# Patient Record
Sex: Female | Born: 1985 | Hispanic: No | Smoking: Never smoker
Health system: Southern US, Community
[De-identification: ages and names within clinical notes are randomized; demographics above are authoritative.]

## PROBLEM LIST (undated history)

## (undated) DIAGNOSIS — F419 Anxiety disorder, unspecified: Secondary | ICD-10-CM

## (undated) DIAGNOSIS — T7840XA Allergy, unspecified, initial encounter: Secondary | ICD-10-CM

## (undated) DIAGNOSIS — J4520 Mild intermittent asthma, uncomplicated: Secondary | ICD-10-CM

## (undated) DIAGNOSIS — N83202 Unspecified ovarian cyst, left side: Secondary | ICD-10-CM

## (undated) DIAGNOSIS — I10 Essential (primary) hypertension: Secondary | ICD-10-CM

## (undated) HISTORY — PX: TYMPANOSTOMY TUBE PLACEMENT: SHX32

## (undated) HISTORY — DX: Allergy, unspecified, initial encounter: T78.40XA

## (undated) HISTORY — DX: Unspecified ovarian cyst, left side: N83.202

## (undated) HISTORY — DX: Mild intermittent asthma, uncomplicated: J45.20

## (undated) HISTORY — PX: MOUTH SURGERY: SHX715

---

## 2005-10-05 LAB — CONVERTED CEMR LAB: Pap Smear: NORMAL

## 2008-03-10 ENCOUNTER — Emergency Department (HOSPITAL_COMMUNITY): Admission: EM | Admit: 2008-03-10 | Discharge: 2008-03-10 | Payer: Self-pay | Admitting: Emergency Medicine

## 2008-03-20 ENCOUNTER — Ambulatory Visit: Payer: Self-pay | Admitting: *Deleted

## 2008-03-20 DIAGNOSIS — J0100 Acute maxillary sinusitis, unspecified: Secondary | ICD-10-CM

## 2008-03-20 DIAGNOSIS — S058X9A Other injuries of unspecified eye and orbit, initial encounter: Secondary | ICD-10-CM | POA: Insufficient documentation

## 2008-03-20 DIAGNOSIS — L259 Unspecified contact dermatitis, unspecified cause: Secondary | ICD-10-CM | POA: Insufficient documentation

## 2008-04-19 ENCOUNTER — Ambulatory Visit: Payer: Self-pay | Admitting: *Deleted

## 2008-04-19 ENCOUNTER — Other Ambulatory Visit: Admission: RE | Admit: 2008-04-19 | Discharge: 2008-04-19 | Payer: Self-pay | Admitting: *Deleted

## 2008-04-19 ENCOUNTER — Encounter (INDEPENDENT_AMBULATORY_CARE_PROVIDER_SITE_OTHER): Payer: Self-pay | Admitting: *Deleted

## 2008-05-24 ENCOUNTER — Telehealth (INDEPENDENT_AMBULATORY_CARE_PROVIDER_SITE_OTHER): Payer: Self-pay | Admitting: *Deleted

## 2008-07-10 ENCOUNTER — Ambulatory Visit: Payer: Self-pay | Admitting: Internal Medicine

## 2008-07-10 DIAGNOSIS — R5383 Other fatigue: Secondary | ICD-10-CM

## 2008-07-10 DIAGNOSIS — J310 Chronic rhinitis: Secondary | ICD-10-CM

## 2008-07-10 DIAGNOSIS — R5381 Other malaise: Secondary | ICD-10-CM

## 2008-07-30 ENCOUNTER — Encounter (INDEPENDENT_AMBULATORY_CARE_PROVIDER_SITE_OTHER): Payer: Self-pay | Admitting: *Deleted

## 2009-10-17 ENCOUNTER — Telehealth: Payer: Self-pay | Admitting: Internal Medicine

## 2009-10-22 ENCOUNTER — Ambulatory Visit: Payer: Self-pay | Admitting: Family

## 2009-10-22 DIAGNOSIS — J321 Chronic frontal sinusitis: Secondary | ICD-10-CM | POA: Insufficient documentation

## 2009-10-28 ENCOUNTER — Telehealth: Payer: Self-pay | Admitting: Family

## 2010-07-08 NOTE — Progress Notes (Signed)
Summary: itching  Phone Note Call from Patient Call back at [phone]   Caller: Patient Reason for Call: Talk to Nurse Summary of Call: pt states she has vaginal itching, thinks it's a reaction to meds, can you call in Rx? Initial call taken by: Lannette Donath,  Oct 28, 2009 12:07 PM  Follow-up for Phone Call        Pt states the itching started 2 days ago. Denies vaginal discharge. Please advise.  Mervin Kung CMA  Oct 28, 2009 12:11 PM   Additional Follow-up for Phone Call Additional follow up Details #1::        Pls let patient know that I sent rx for Diflucan to her pharmacy.  She should call if symptoms not resolved in 1 week. Additional Follow-up by: Lemont Fillers FNP,  Oct 28, 2009 1:20 PM    Additional Follow-up for Phone Call Additional follow up Details #2::    Left message on machine to return my call.  Mervin Kung CMA  Oct 28, 2009 1:55 PM   Advised pt. of Marny Smethers's instructions. Pt voices understanding and asks that we send future rxs to [facility] on [address].  Pt will get current script from [facility].  [facility] was removed from her profile and [facility] has been added.  Mervin Kung CMA  Oct 28, 2009 3:52 PM   New/Updated Medications: FLUCONAZOLE 150 MG TABS (FLUCONAZOLE) one tablet by mouth now, repeat in 3 days if still experiencing itching. Prescriptions: FLUCONAZOLE 150 MG TABS (FLUCONAZOLE) one tablet by mouth now, repeat in 3 days if still experiencing itching.  #2 x 0   Entered and Authorized by:   Lemont Fillers FNP   Signed by:   Lemont Fillers FNP on 10/28/2009   Method used:   Electronically to        [facility]. [address] [phone]* (retail)       [address]       [place], [place]  [address]       Ph: [phone] or [phone]       Fax: [phone]   RxID:   [id number]

## 2010-07-08 NOTE — Progress Notes (Signed)
Summary: Allergy Medication  Phone Note Call from Patient Call back at [phone]   Reason for Call: Refill Medication Summary of Call: Pt states she needs refill of allergy meds, pls call Initial call taken by: Lannette Donath,  Oct 17, 2009 11:43 AM  Follow-up for Phone Call        call placed to patient at [phone], she states she has a cough, that  occurrs at night , she is having eye,  throat, and ear irritation,. She was advised to schedule an office visit, but declines, and states she is just having allergy symptoms and would like some medication for that. She was advised that I would check with the doctor and give her a call back  Follow-up by: Glendell Docker CMA,  Oct 17, 2009 5:45 PM  Additional Follow-up for Phone Call Additional follow up Details #1::        zyrtec, claritin, fexofenadine can be obtained over the counter Additional Follow-up by: D. Thomos Lemons DO,  Oct 18, 2009 7:59 AM    Additional Follow-up for Phone Call Additional follow up Details #2::    attempted to contact patient at [phone], no answer, detailed voice message left informing patient per Dr Artist Pais instructions. She was advised to schedule office visit if something stronger was needed Follow-up by: Glendell Docker CMA,  Oct 18, 2009 8:14 AM

## 2010-07-08 NOTE — Assessment & Plan Note (Signed)
Summary: SINUS INFECTION/HEA   Vital Signs:  Rebecca profile:   25 year old Roach Height:      64 inches Weight:      128 pounds BMI:     22.05 Temp:     97.2 degrees F oral Pulse rate:   90 / minute Pulse rhythm:   regular Resp:     18 per minute BP sitting:   128 / 79  (right arm) Cuff size:   regular  Vitals Entered By: Mervin Kung CMA (Oct 22, 2009 3:43 PM) CC: room 5  Pt states her allergies have gotten worse. Has head congestion, pressure, itchy throat and watery eyes. Symptoms seem worse at night. Meds do not seem to be helping. Is Rebecca Diabetic? No   Primary Care Provider:  Paulo Fruit MD  CC:  room 5  Pt states her allergies have gotten worse. Has head congestion, pressure, and itchy throat and watery eyes. Symptoms seem worse at night. Meds do not seem to be helping.Marland Kitchen  History of Present Illness: Rebecca Roach is a 25 year old Roach who presents with c/o itching eyes, sore/itching throat, itching ears.  + pressure over her forehead.  Clear runny nasal discharge.  Denies fever.  These symptoms have been present x 4 weeks.  These symptoms started around the same time that she got her cat and she wonders if these symptoms are due to cat allergies.  Allergies: 1)  ! * Oxycodone  Physical Exam  General:  Well-developed,well-nourished,in no acute distress; alert,appropriate and cooperative throughout examination Head:  Normocephalic and atraumatic without obvious abnormalities. No apparent alopecia or balding. Eyes:  PERRLA Ears:  Bilateral TM's are dull without bulging or erythema Mouth:  Oral mucosa and oropharynx without lesions or exudates.  Teeth in good repair. Lungs:  Normal respiratory effort, chest expands symmetrically. Lungs are clear to auscultation, no crackles or wheezes. Heart:  Normal rate and regular rhythm. S1 and S2 normal without gallop, murmur, click, rub or other extra sounds.   Impression & Recommendations:  Problem # 1:  FRONTAL  SINUSITIS (ICD-473.1) Assessment New Suspect early sinusitus.  Plan to continue fexofenadine and will at Fluticasone spray + amoxicillin course.  Discussed cat allergy with Rebecca.  Her allergic symptoms have coincided with high pollen counts.  If symptoms do not improve as pollen counts drop then she may need to more seriousy consider that her symptoms may be exacerbated by presece of cat in the home.  Her updated medication list for this problem includes:    Fluticasone Propionate 50 Mcg/act Susp (Fluticasone propionate) .Marland Kitchen... 2 sprays each nostril daily    Amoxicillin 500 Mg Caps (Amoxicillin) ..... One tab by mouth three times a day x 10 days  Complete Medication List: 1)  Fexofenadine Hcl 180 Mg Tabs (Fexofenadine hcl) .... One tab by mouth once daily as needed 2)  One-a-day Womens Formula Tabs (Multiple vitamins-calcium) .... Take 1 tablet by mouth once a day 3)  Fluticasone Propionate 50 Mcg/act Susp (Fluticasone propionate) .... 2 sprays each nostril daily 4)  Amoxicillin 500 Mg Caps (Amoxicillin) .... One tab by mouth three times a day x 10 days  Rebecca Instructions: 1)  Call if symptoms worsen or do not improve.  Prescriptions: FLUTICASONE PROPIONATE 50 MCG/ACT SUSP (FLUTICASONE PROPIONATE) 2 sprays each nostril daily  #1 x 1   Entered and Authorized by:   Lemont Fillers FNP   Signed by:   Lemont Fillers FNP on 10/22/2009   Method used:  Electronically to        [facility]. [address] [phone]* (retail)       [address]       [place], [place]  [address]       Ph: [phone] or [phone]       Fax: [phone]   RxID:   [id number] AMOXICILLIN 500 MG CAPS (AMOXICILLIN) one tab by mouth three times a day x 10 days  #30 x 0   Entered and Authorized by:   Lemont Fillers FNP   Signed by:   Lemont Fillers FNP on 10/22/2009   Method used:   Electronically to        [facility]. [address] [phone]* (retail)       [address]       [place], [place]  [address]       Ph: [phone] or [phone]       Fax: [phone]   RxID:   [id number] FLUTICASONE PROPIONATE 50 MCG/ACT SUSP (FLUTICASONE PROPIONATE) 2 sprays each nostril daily  #1 x 1   Entered and Authorized by:   Lemont Fillers FNP   Signed by:   Lemont Fillers FNP on 10/22/2009   Method used:   Electronically to        [facility]. # [id number]* (retail)       [address] [place], [place]  [address]       Ph: [phone]       Fax: [phone]   RxID:   [id number]   Current Allergies (reviewed today): ! * OXYCODONE

## 2011-03-17 ENCOUNTER — Other Ambulatory Visit: Payer: Self-pay | Admitting: Family Medicine

## 2011-03-17 ENCOUNTER — Other Ambulatory Visit (HOSPITAL_COMMUNITY)
Admission: RE | Admit: 2011-03-17 | Discharge: 2011-03-17 | Disposition: A | Discharge status: Home / Self Care | Payer: Managed Care, Other (non HMO) | Source: Ambulatory Visit | Attending: Family Medicine | Admitting: Family Medicine

## 2011-03-17 DIAGNOSIS — Z124 Encounter for screening for malignant neoplasm of cervix: Secondary | ICD-10-CM | POA: Insufficient documentation

## 2011-11-16 ENCOUNTER — Encounter (HOSPITAL_COMMUNITY): Payer: Self-pay | Admitting: *Deleted

## 2011-11-16 ENCOUNTER — Inpatient Hospital Stay (HOSPITAL_COMMUNITY): Payer: Managed Care, Other (non HMO)

## 2011-11-16 ENCOUNTER — Inpatient Hospital Stay (HOSPITAL_COMMUNITY)
Admission: AD | Admit: 2011-11-16 | Discharge: 2011-11-16 | Disposition: A | Discharge status: Home / Self Care | Payer: Managed Care, Other (non HMO) | Source: Ambulatory Visit | Attending: Obstetrics and Gynecology | Admitting: Obstetrics and Gynecology

## 2011-11-16 DIAGNOSIS — O034 Incomplete spontaneous abortion without complication: Secondary | ICD-10-CM | POA: Insufficient documentation

## 2011-11-16 HISTORY — DX: Anxiety disorder, unspecified: F41.9

## 2011-11-16 LAB — CBC
HCT: 35.5 % — ABNORMAL LOW (ref 36.0–46.0)
MCH: 31.2 pg (ref 26.0–34.0)
MCHC: 34.1 g/dL (ref 30.0–36.0)
MCV: 91.5 fL (ref 78.0–100.0)
Platelets: 212 10*3/uL (ref 150–400)
RDW: 12.6 % (ref 11.5–15.5)

## 2011-11-16 LAB — URINALYSIS, ROUTINE W REFLEX MICROSCOPIC
Bilirubin Urine: NEGATIVE
Nitrite: NEGATIVE
Specific Gravity, Urine: 1.025 (ref 1.005–1.030)
pH: 5.5 (ref 5.0–8.0)

## 2011-11-16 LAB — URINE MICROSCOPIC-ADD ON

## 2011-11-16 LAB — POCT PREGNANCY, URINE: Preg Test, Ur: POSITIVE — AB

## 2011-11-16 LAB — WET PREP, GENITAL

## 2011-11-16 NOTE — MAU Provider Note (Signed)
Rebecca Roach y.o.G1P0 @[redacted]w[redacted]d  by LMP Chief Complaint  Patient presents with  . Back Pain  . Vaginal Bleeding     First Provider Initiated Contact with Patient 11/16/11 1426      SUBJECTIVE  HPI: Pt presents with dark brown vaginal spotting today and bright red bleeding yesterday reporting 3 soaked pads.  She is having mild abdominal cramping also.  She denies LOF, vaginal itching/burning, urinary symptoms, h/a, dizziness, n/v, or fever/chills.    Past Medical History  Diagnosis Date  . Anxiety    Past Surgical History  Procedure Date  . No past surgeries    History   Social History  . Marital Status: Married    Spouse Name: N/A    Number of Children: N/A  . Years of Education: N/A   Occupational History  . Not on file.   Social History Main Topics  . Smoking status: Never Smoker   . Smokeless tobacco: Not on file  . Alcohol Use: No  . Drug Use: No  . Sexually Active: Yes    Birth Control/ Protection: None   Other Topics Concern  . Not on file   Social History Narrative  . No narrative on file   No current facility-administered medications on file prior to encounter.   Current Outpatient Prescriptions on File Prior to Encounter  Medication Sig Dispense Refill  . budesonide (RHINOCORT AQUA) 32 MCG/ACT nasal spray Place 1 spray into the nose at bedtime.      Marland Kitchen escitalopram (LEXAPRO) 10 MG tablet Take 10 mg by mouth at bedtime.      . montelukast (SINGULAIR) 10 MG tablet Take 10 mg by mouth at bedtime.       Allergies  Allergen Reactions  . Oxycodone Nausea And Vomiting  . Latex Rash    ROS: Pertinent items in HPI  OBJECTIVE Blood pressure 127/59, pulse 83, temperature 97.4 F (36.3 C), temperature source Oral, resp. rate 18, height 5\' 4"  (1.626 m), weight 61.961 kg (136 lb 9.6 oz), last menstrual period 10/06/2011.  GENERAL: Well-developed, well-nourished female in no acute distress.  HEENT: Normocephalic, good dentition HEART: normal rate RESP:  normal effort ABDOMEN: Soft, nontender EXTREMITIES: Nontender, no edema NEURO: Alert and oriented Pelvic exam: Cervix pink, visually closed, without lesion, POC visible at cervical os, removed easily with ring forceps, scant white discharge noted, vaginal walls and external genitalia normal Bimanual exam: Cervix 0/long/high,soft, anterior, neg CMT, uterus nontender, nonenlarged, adnexa without tenderness, enlargement, or mass   LAB RESULTS  Results for orders placed during the hospital encounter of 11/16/11 (from the past 24 hour(s))  URINALYSIS, ROUTINE W REFLEX MICROSCOPIC     Status: Abnormal   Collection Time   11/16/11  1:40 PM      Component Value Range   Color, Urine YELLOW  YELLOW    APPearance CLEAR  CLEAR    Specific Gravity, Urine 1.025  1.005 - 1.030    pH 5.5  5.0 - 8.0    Glucose, UA NEGATIVE  NEGATIVE (mg/dL)   Hgb urine dipstick LARGE (*) NEGATIVE    Bilirubin Urine NEGATIVE  NEGATIVE    Ketones, ur NEGATIVE  NEGATIVE (mg/dL)   Protein, ur NEGATIVE  NEGATIVE (mg/dL)   Urobilinogen, UA 0.2  0.0 - 1.0 (mg/dL)   Nitrite NEGATIVE  NEGATIVE    Leukocytes, UA NEGATIVE  NEGATIVE   URINE MICROSCOPIC-ADD ON     Status: Abnormal   Collection Time   11/16/11  1:40 PM  Component Value Range   Squamous Epithelial / LPF FEW (*) RARE    WBC, UA 0-2  <3 (WBC/hpf)   RBC / HPF 7-10  <3 (RBC/hpf)  POCT PREGNANCY, URINE     Status: Abnormal   Collection Time   11/16/11  2:04 PM      Component Value Range   Preg Test, Ur POSITIVE (*) NEGATIVE   CBC     Status: Abnormal   Collection Time   11/16/11  2:30 PM      Component Value Range   WBC 6.7  4.0 - 10.5 (K/uL)   RBC 3.88  3.87 - 5.11 (MIL/uL)   Hemoglobin 12.1  12.0 - 15.0 (g/dL)   HCT 16.1 (*) 09.6 - 46.0 (%)   MCV 91.5  78.0 - 100.0 (fL)   MCH 31.2  26.0 - 34.0 (pg)   MCHC 34.1  30.0 - 36.0 (g/dL)   RDW 04.5  40.9 - 81.1 (%)   Platelets 212  150 - 400 (K/uL)  ABO/RH     Status: Normal   Collection Time   11/16/11   2:30 PM      Component Value Range   ABO/RH(D) A POS    HCG, QUANTITATIVE, PREGNANCY     Status: Abnormal   Collection Time   11/16/11  2:30 PM      Component Value Range   hCG, Beta Chain, Quant, S 1962 (*) <5 (mIU/mL)  WET PREP, GENITAL     Status: Abnormal   Collection Time   11/16/11  3:01 PM      Component Value Range   Yeast Wet Prep HPF POC NONE SEEN  NONE SEEN    Trich, Wet Prep NONE SEEN  NONE SEEN    Clue Cells Wet Prep HPF POC NONE SEEN  NONE SEEN    WBC, Wet Prep HPF POC FEW (*) NONE SEEN     IMAGING   ASSESSMENT Incomplete miscarriage  PLAN D/C home with bleeding precautions POC sent to pathology Return to MAU on Wednesday after 3pm for quantitative HCG Discussed expected miscarriage with pt and pt given opportunity to talk with Chaplain but she denied needing this Will review findings on Wednesday and create f/u plan  LEFTWICH-KIRBY, Bleu Minerd 11/16/2011 3:01 PM

## 2011-11-16 NOTE — Discharge Instructions (Signed)
Miscarriage  An early pregnancy loss or spontaneous abortion (miscarriage) is a common problem. This usually happens when the pregnancy is not developing normally. It is very unlikely that you or your partner did anything to cause this, although cigarette smoking, a sexually transmitted disease, excessive alcohol use, or drug abuse can increase the risk. Other causes are:   Abnormalities of the uterus.   Hormone or medical problems.   Trauma or genetic (chromosome) problems.  Having a miscarriage does not change your chances of having a normal pregnancy in the future. Your caregiver will advise you when it is safe to try to get pregnant again.  AFTER A MISCARRIAGE   A miscarriage is inevitable when there is continual, heavy vaginal bleeding; cramping; dilation of the cervix; or passing of any pregnancy tissue. Bleeding and cramping will usually continue until all the tissue has been removed from the womb (uterus).   Often the uterus does not clean itself out completely. A medication or a D&C procedure is needed to loosen or remove the pregnancy tissue from the uterus. A D&C scrapes or suctions the tissue out.   If you are RH negative, you may need to have Rh immune globulin to avoid Rh problems.   You may be given medication to fight an infection if the miscarriage was due to an infection.  HOME CARE INSTRUCTIONS    You should rest in bed for the next 2 to 3 days.   Do not take tub baths or put anything in your vagina, including tampons or a douche.   Do not have sex until your caregiver approves.   Avoid exercise or heavy activities until directed by your caregiver.   Save any vaginal discharge that looks like tissue. Ask your caregiver if he or she wants to inspect the discharge.   If you and your partner are having problems with guilt or grieving, talk to your caregiver or get counseling to help you understand and cope with your pregnancy loss.   Allow enough time to grieve before trying to get  pregnant again.  SEEK IMMEDIATE MEDICAL CARE IF:    You have persistent heavy bleeding or a bad smelling vaginal discharge.   You have continued abdominal or pelvic pain.   You have an oral temperature above 102 F (38.9 C), not controlled by medicine.   You have severe weakness, fainting, or keep throwing up (vomiting).   You develop chills.   You are experiencing domestic violence.  MAKE SURE YOU:    Understand these instructions.   Will watch your condition.   Will get help right away if you are not doing well or get worse.  Document Released: 07/02/2004 Document Revised: 05/14/2011 Document Reviewed: 05/17/2008  Exit[employer] Patient Information 2012 [employer].

## 2011-11-16 NOTE — MAU Note (Signed)
Pt in c/o vaginal bleeding since yesterday, reports seeing multiple "stringy clots".  States bleeding was heavier yesterday, went through 3 pads, has only used 1 pad today.  Reports lower back pain for a couple days.  States lower abdomen just feels full, no real pain.  Has hx of irregular periods.  Believes lmp was around end of April.

## 2011-11-17 ENCOUNTER — Encounter (HOSPITAL_COMMUNITY): Payer: Self-pay | Admitting: *Deleted

## 2011-11-17 ENCOUNTER — Inpatient Hospital Stay (HOSPITAL_COMMUNITY)
Admission: AD | Admit: 2011-11-17 | Discharge: 2011-11-17 | Disposition: A | Discharge status: Home / Self Care | Payer: Managed Care, Other (non HMO) | Source: Ambulatory Visit | Attending: Family Medicine | Admitting: Family Medicine

## 2011-11-17 DIAGNOSIS — O200 Threatened abortion: Secondary | ICD-10-CM | POA: Insufficient documentation

## 2011-11-17 DIAGNOSIS — O209 Hemorrhage in early pregnancy, unspecified: Secondary | ICD-10-CM

## 2011-11-17 LAB — HCG, QUANTITATIVE, PREGNANCY: hCG, Beta Chain, Quant, S: 1766 m[IU]/mL — ABNORMAL HIGH (ref <5)

## 2011-11-17 LAB — GC/CHLAMYDIA PROBE AMP, GENITAL
Chlamydia, DNA Probe: NEGATIVE
GC Probe Amp, Genital: NEGATIVE

## 2011-11-17 MED ORDER — IBUPROFEN 600 MG PO TABS: 600.0000 mg | ORAL_TABLET | Freq: Four times a day (QID) | ORAL | Status: AC | PRN

## 2011-11-17 MED ORDER — IBUPROFEN 600 MG PO TABS: 600.0000 mg | ORAL_TABLET | Freq: Four times a day (QID) | ORAL | Status: DC | PRN

## 2011-11-17 NOTE — MAU Note (Signed)
Was here yesterday, had [place] and blood work. Was to come tomorrow for repeat blood work tomorrow.  Pain has gotten worse, bleeding is continues and is getting forgetful.

## 2011-11-17 NOTE — Discharge Instructions (Signed)
Threatened Miscarriage  A threatened miscarriage is a pregnancy that may end. It may be marked by bleeding during the first 20 weeks of pregnancy. Often, the pregnancy can continue without any more problems. You may be asked to stop:  Having sex (intercourse).   Having orgasms.   Using tampons.   Exercising.   Doing heavy physical activity and work.  HOME CARE   Your doctor may tell you to take bed rest and to stop activities and work.   Write down the number of pads you use each day. Write down how often you change pads. Write down how soaked they are.   Follow your doctor's advice for follow-up visits and tests.   If your blood type is Rh-negative and the father's blood is Rh-positive (or is not known), you may get a shot to protect the baby.   If you have a miscarriage, save all the tissue you pass in a container. Take the container to your doctor.  GET HELP RIGHT AWAY IF:   You have bad cramps or pain in your belly (abdomen), lower belly, or back.   You have a fever or chills.   Your bleeding gets worse or you pass large clots of blood or tissue. Save this tissue to show your doctor.   You feel lightheaded, weak, dizzy, or pass out (faint).   You have a gush of fluid from your vagina.  MAKE SURE YOU:   Understand these instructions.   Will watch your condition.   Will get help right away if you are not doing well or get worse.  Document Released: 05/07/2008 Document Revised: 05/14/2011 Document Reviewed: 06/10/2009 Exit[employer] Patient Information 2012 [employer]. 

## 2011-11-17 NOTE — MAU Provider Note (Signed)
Rebecca Roach [id number] y.o.G1P0 @[redacted]w[redacted]d  by LMP Chief Complaint  Patient presents with  . Follow-up     First Provider Initiated Contact with Patient 11/17/11 1845      SUBJECTIVE  HPI: Seen here yesterday for early pregnancy bleeding and falling MAU passed what was thought to be tissue and this was sent to pathology. Her ultrasound showed heterogenous thickening of endometrium possible POC versus very early pregnancy, no gestational sac was seen, adnexae were negative. Plan was to repeat upon tomorrow however she comes today saying that she felt weak and dizzy prior to coming in and was concerned that she is still bleeding and cramping. She took 2 tablets 200 mg ibuprofen last night and 1 tablet this morning. She is on only her second pad of the day.  Past Medical History  Diagnosis Date  . Anxiety    Past Surgical History  Procedure Date  . Mouth surgery     gum   History   Social History  . Marital Status: Married    Spouse Name: N/A    Number of Children: N/A  . Years of Education: N/A   Occupational History  . Not on file.   Social History Main Topics  . Smoking status: Never Smoker   . Smokeless tobacco: Not on file  . Alcohol Use: No  . Drug Use: No  . Sexually Active: Yes    Birth Control/ Protection: None   Other Topics Concern  . Not on file   Social History Narrative  . No narrative on file   No current facility-administered medications on file prior to encounter.   Current Outpatient Prescriptions on File Prior to Encounter  Medication Sig Dispense Refill  . Biotin 1000 MCG tablet Take 1,000 mcg by mouth at bedtime.      . budesonide (RHINOCORT AQUA) 32 MCG/ACT nasal spray Place 1 spray into the nose at bedtime.      . cholecalciferol (VITAMIN D) 1000 UNITS tablet Take 1,000 Units by mouth at bedtime.      Marland Kitchen escitalopram (LEXAPRO) 10 MG tablet Take 10 mg by mouth at bedtime.      . montelukast (SINGULAIR) 10 MG tablet Take 10 mg by mouth at bedtime.        . Multiple Vitamin (MULTIVITAMIN WITH MINERALS) TABS Take 1 tablet by mouth at bedtime.       Allergies  Allergen Reactions  . Oxycodone Nausea And Vomiting  . Latex Rash    ROS: Pertinent items in HPI  OBJECTIVE Blood pressure 116/54, pulse 83, temperature 98.3 F (36.8 C), temperature source Oral, resp. rate 20, height 5' 4.5" (1.638 m), weight 63.504 kg (140 lb), last menstrual period 10/06/2011.  Orthostatic VS reviewed  GENERAL: Well-developed, well-nourished female in no acute distress.  HEENT: Normocephalic, good dentition HEART: normal rate RESP: normal effort ABDOMEN: Soft, nontender EXTREMITIES: Nontender, no edema NEURO: Alert and oriented SPECULUM EXAM: NEFG, small amt dark blood blood noted, cervix clean and no blood seen coming from os BIMANUAL: cervix closed; uterus NT, slightly enlarged; no adnexal tenderness or masses    LAB RESULTS Results for orders placed during the hospital encounter of 11/17/11 (from the past 24 hour(s))  HCG, QUANTITATIVE, PREGNANCY     Status: Abnormal   Collection Time   11/17/11  4:56 PM      Component Value Range   hCG, Beta Chain, Quant, S 1766 (*) <5 (mIU/mL)   Path result pending from tissue sent yesterday pending  ASSESSMENT Early pregnancy bleeding, likely SAB Hemodynamically stable  PLAN Rx ibuprofen 600 mg by mouth every 6 hours D/W Dr.Pratt: repeat quant in 2 days. Home with bleeding precautions     Cathi Hazan 11/17/2011 6:50 PM

## 2011-11-18 NOTE — MAU Provider Note (Signed)
Agree with above note.  Rebecca Roach 11/18/2011 9:46 AM

## 2011-11-18 NOTE — MAU Provider Note (Signed)
Chart reviewed and agree with management and plan.  

## 2011-11-19 ENCOUNTER — Encounter (HOSPITAL_COMMUNITY): Payer: Self-pay | Admitting: *Deleted

## 2011-11-19 ENCOUNTER — Inpatient Hospital Stay (HOSPITAL_COMMUNITY)
Admission: AD | Admit: 2011-11-19 | Discharge: 2011-11-19 | Disposition: A | Discharge status: Home / Self Care | Payer: Managed Care, Other (non HMO) | Source: Ambulatory Visit | Attending: Obstetrics & Gynecology | Admitting: Obstetrics & Gynecology

## 2011-11-19 DIAGNOSIS — R5383 Other fatigue: Secondary | ICD-10-CM | POA: Insufficient documentation

## 2011-11-19 DIAGNOSIS — M549 Dorsalgia, unspecified: Secondary | ICD-10-CM | POA: Insufficient documentation

## 2011-11-19 DIAGNOSIS — O99891 Other specified diseases and conditions complicating pregnancy: Secondary | ICD-10-CM | POA: Insufficient documentation

## 2011-11-19 DIAGNOSIS — R5381 Other malaise: Secondary | ICD-10-CM | POA: Insufficient documentation

## 2011-11-19 LAB — HCG, QUANTITATIVE, PREGNANCY: hCG, Beta Chain, Quant, S: 1365 m[IU]/mL — ABNORMAL HIGH (ref <5)

## 2011-11-19 NOTE — MAU Note (Signed)
TOOK IBUPROFEN AT 430PM-- FOR BACK PAIN- THIS STARTED  ON Tuesday. SHE FEELS WEAK - BUT NOT AS BAD AS YESTERDAY.   SAYS WHEN SHE WIPES - IT LOOKS LIKE  BLOOD  WITH BLOOD CLOTS-  QUARTER SIZE-  THIS IS SAME AS HAS BEEN.Marland Kitchen  HAD LABS DRAWN ON Monday.

## 2011-11-20 NOTE — OB Triage Provider Note (Signed)
S: 27 y.o. G1P0 @[redacted]w[redacted]d  by LMP  Pt presents to MAU for repeat quantitative HCG today.  She reports some light vaginal bleeding and mild cramping, and this has not changed since her previous visits to the MAU on 11/16/11 and 11/17/11.   O:  Recent Results (from the past 168 hour(s))  URINALYSIS, ROUTINE W REFLEX MICROSCOPIC   Collection Time   11/16/11  1:40 PM      Component Value Range   Color, Urine YELLOW  YELLOW   APPearance CLEAR  CLEAR   Specific Gravity, Urine 1.025  1.005 - 1.030   pH 5.5  5.0 - 8.0   Glucose, UA NEGATIVE  NEGATIVE mg/dL   Hgb urine dipstick LARGE (*) NEGATIVE   Bilirubin Urine NEGATIVE  NEGATIVE   Ketones, ur NEGATIVE  NEGATIVE mg/dL   Protein, ur NEGATIVE  NEGATIVE mg/dL   Urobilinogen, UA 0.2  0.0 - 1.0 mg/dL   Nitrite NEGATIVE  NEGATIVE   Leukocytes, UA NEGATIVE  NEGATIVE  URINE MICROSCOPIC-ADD ON   Collection Time   11/16/11  1:40 PM      Component Value Range   Squamous Epithelial / LPF FEW (*) RARE   WBC, UA 0-2  <3 WBC/hpf   RBC / HPF 7-10  <3 RBC/hpf  POCT PREGNANCY, URINE   Collection Time   11/16/11  2:04 PM      Component Value Range   Preg Test, Ur POSITIVE (*) NEGATIVE  CBC   Collection Time   11/16/11  2:30 PM      Component Value Range   WBC 6.7  4.0 - 10.5 K/uL   RBC 3.88  3.87 - 5.11 MIL/uL   Hemoglobin 12.1  12.0 - 15.0 g/dL   HCT 16.1 (*) 09.6 - 04.5 %   MCV 91.5  78.0 - 100.0 fL   MCH 31.2  26.0 - 34.0 pg   MCHC 34.1  30.0 - 36.0 g/dL   RDW 40.9  81.1 - 91.4 %   Platelets 212  150 - 400 K/uL  ABO/RH   Collection Time   11/16/11  2:30 PM      Component Value Range   ABO/RH(D) A POS    HCG, QUANTITATIVE, PREGNANCY   Collection Time   11/16/11  2:30 PM      Component Value Range   hCG, Beta Chain, Quant, S 1962 (*) <5 mIU/mL  WET PREP, GENITAL   Collection Time   11/16/11  3:01 PM      Component Value Range   Yeast Wet Prep HPF POC NONE SEEN  NONE SEEN   Trich, Wet Prep NONE SEEN  NONE SEEN   Clue Cells Wet Prep HPF POC  NONE SEEN  NONE SEEN   WBC, Wet Prep HPF POC FEW (*) NONE SEEN  GC/CHLAMYDIA PROBE AMP, GENITAL   Collection Time   11/16/11  3:01 PM      Component Value Range   GC Probe Amp, Genital NEGATIVE  NEGATIVE   Chlamydia, DNA Probe NEGATIVE  NEGATIVE  HCG, QUANTITATIVE, PREGNANCY   Collection Time   11/17/11  4:56 PM      Component Value Range   hCG, Beta Chain, Quant, S 1766 (*) <5 mIU/mL  HCG, QUANTITATIVE, PREGNANCY   Collection Time   11/19/11  7:06 PM      Component Value Range   hCG, Beta Chain, Quant, S 1365 (*) <5 mIU/mL    A: Decrease in HCG indicates likely SAB, unlikely ectopic pregnancy  P:  Called Dr Debroah Loop to discuss U/S findings of no IUP on 11/16/11 and repeat HCG labs D/C home with ectopic pregnancy teaching/precautions, and bleeding precautions Return to MAU for quantitative HCG in 1 week Return to MAU sooner as needed  Rebecca Roach, Rebecca Roach

## 2011-11-25 ENCOUNTER — Inpatient Hospital Stay (HOSPITAL_COMMUNITY)
Admission: AD | Admit: 2011-11-25 | Discharge: 2011-11-25 | Disposition: A | Discharge status: Home / Self Care | Payer: Managed Care, Other (non HMO) | Source: Ambulatory Visit | Attending: Obstetrics & Gynecology | Admitting: Obstetrics & Gynecology

## 2011-11-25 DIAGNOSIS — O039 Complete or unspecified spontaneous abortion without complication: Secondary | ICD-10-CM | POA: Insufficient documentation

## 2011-11-25 LAB — HCG, QUANTITATIVE, PREGNANCY: hCG, Beta Chain, Quant, S: 973 m[IU]/mL — ABNORMAL HIGH (ref <5)

## 2011-11-25 NOTE — MAU Note (Signed)
Pt is here for repeat BHCG. "light  Brown bleeding and small clots", denies pain at this time

## 2011-11-25 NOTE — MAU Note (Signed)
Lab results completed, pt not in lobby. She left message with registration that she would return .

## 2011-11-25 NOTE — MAU Provider Note (Signed)
History   Chief Complaint:  No chief complaint on file.   Rebecca Roach is  26 y.o. G1P0 Patient's last menstrual period was 10/06/2011.Marland Kitchen Patient is here for follow up of quantitative HCG and ongoing surveillance of pregnancy status.     Since her last visit, the patient is without new complaint.   The patient reports bleeding as  brown and spotting.    General ROS:  negative  Her previous Quantitative HCG values are: 6/10: 1962, 6/11: 1766, 6/13: 1365    Physical Exam   Last menstrual period 10/06/2011.  Focused Gynecological Exam: examination not indicated  Labs: Recent Results (from the past 24 hour(s))  HCG, QUANTITATIVE, PREGNANCY   Collection Time   11/25/11  9:15 PM      Component Value Range   hCG, Beta Chain, Quant, S 973 (*) <5 mIU/mL     Assessment: SAB   Plan: Patient called with FU quant results Reviewed SAB diagnoses and anticipatory guidance, expectations. Pt offered FU appt in [facility]. Declined. States she has an appt with PCP at [facility] July.    Dorotea Hand E. 11/25/2011, 10:42 PM

## 2011-11-25 NOTE — MAU Note (Signed)
Pt calls back to get results instead of returning. [place],CNM spoke with pt and gave results and instructions.

## 2012-04-20 ENCOUNTER — Ambulatory Visit (INDEPENDENT_AMBULATORY_CARE_PROVIDER_SITE_OTHER): Payer: Managed Care, Other (non HMO) | Admitting: Sports Medicine

## 2012-04-20 ENCOUNTER — Encounter: Payer: Self-pay | Admitting: Sports Medicine

## 2012-04-20 VITALS — BP 144/87 | HR 99 | Wt 141.0 lb

## 2012-04-20 DIAGNOSIS — R635 Abnormal weight gain: Secondary | ICD-10-CM | POA: Insufficient documentation

## 2012-04-20 DIAGNOSIS — F411 Generalized anxiety disorder: Secondary | ICD-10-CM

## 2012-04-20 DIAGNOSIS — M791 Myalgia, unspecified site: Secondary | ICD-10-CM

## 2012-04-20 DIAGNOSIS — R5383 Other fatigue: Secondary | ICD-10-CM

## 2012-04-20 DIAGNOSIS — R5381 Other malaise: Secondary | ICD-10-CM

## 2012-04-20 DIAGNOSIS — M4310 Spondylolisthesis, site unspecified: Secondary | ICD-10-CM | POA: Insufficient documentation

## 2012-04-20 DIAGNOSIS — F419 Anxiety disorder, unspecified: Secondary | ICD-10-CM | POA: Insufficient documentation

## 2012-04-20 DIAGNOSIS — IMO0001 Reserved for inherently not codable concepts without codable children: Secondary | ICD-10-CM

## 2012-04-20 DIAGNOSIS — Z299 Encounter for prophylactic measures, unspecified: Secondary | ICD-10-CM | POA: Insufficient documentation

## 2012-04-20 MED ORDER — MELOXICAM 15 MG PO TABS: ORAL_TABLET | ORAL | Status: DC

## 2012-04-20 NOTE — Assessment & Plan Note (Signed)
Lipid panel. She will come back fasting for this.

## 2012-04-20 NOTE — Assessment & Plan Note (Signed)
We'll check CBC, TSH, CMET. Mood is okay per patient.

## 2012-04-20 NOTE — Assessment & Plan Note (Signed)
Recently did a two-mile run 5 days ago. Soreness and back, hamstrings, and calf. I do suspect delayed onset muscle soreness. Mobic, on rehabilitation for hamstrings, and gastrocs.

## 2012-04-20 NOTE — Progress Notes (Signed)
Subjective:     Patient ID: Rebecca Roach, female   DOB: 04/24/1986, 26 y.o.   MRN: [id number]  HPI Patient is a 26 yo female with a history of anxiety who is presenting today for establishment of primary care, lower back pain, left leg pain and weight gain.    Patient states that her back pain started on Saturday after running a "2 mile zombie race". It is a mild pain that comes and goes. She has taken Ibuprofin for relief and thinks it helps a little bit. Coughing makes the pain worse.   Left leg pain: Pain has been in her left calf and left thigh for around 1 year. It is a mild tightness but not necessarily pain. It improves slightly with Ibuprofin. The pain does not reach any part of her foot. She denies any numbness or tingling.   Weight gain: Patient states that she has gained 15-20 pounds over the last 6 months. She is worried that it is due to her medications. On exam she admitted to having a highly irregular diet that consists of eating out a lot and skipping meals.   Review of Systems  Constitutional: Positive for unexpected weight change.  HENT: Negative.   Eyes: Negative.   Respiratory: Positive for cough.   Cardiovascular: Negative.   Gastrointestinal: Negative.   Genitourinary: Negative.   Musculoskeletal: Positive for myalgias and back pain.  Skin: Negative.   Neurological: Negative.   Hematological: Negative.   Psychiatric/Behavioral: Negative.        Objective:   Physical Exam  Constitutional: She is oriented to person, place, and time. She appears well-developed and well-nourished.  HENT:  Head: Normocephalic and atraumatic.  Eyes: Pupils are equal, round, and reactive to light.  Neck: Neck supple. No thyromegaly present.  Cardiovascular: Regular rhythm, normal heart sounds and intact distal pulses.        Tachycardic on exam  Pulmonary/Chest: Effort normal and breath sounds normal. No respiratory distress. She has no wheezes. She has no rales.  Abdominal:  Soft. Bowel sounds are normal.  Musculoskeletal: Normal range of motion.       Pain on palpation of left gastrocnemius. 4/5 strength is knee flexion  Neurological: She is alert and oriented to person, place, and time. She has normal reflexes.  Skin: Skin is warm.  Psychiatric: She has a normal mood and affect. Her behavior is normal. Judgment and thought content normal.       Assessment/Plan:

## 2012-04-20 NOTE — Assessment & Plan Note (Signed)
We'll start with checking CBC, TSH. Exercise prescription will be given. At future visits, we can discuss dieting strategies.

## 2012-04-20 NOTE — Assessment & Plan Note (Signed)
Seems to be mostly situational. I think she should stop her Xanax. We will stop her Lexapro at some point in the future. I do think it's beneficial right now.

## 2012-04-20 NOTE — Patient Instructions (Signed)
Exercise prescription:  You should adjust the intensity of your exercise based on your heart rate. The [employer] sports medicine recommends keeping your heart rate between 70-80% of its maximum for 30 minutes, 3-5 times per week. Maximum heart rate = (220 - age). Multiply this number by 0.75 to get your goal heart rate. If lower, then increase the intensity of your exercise. If the number is higher, you may decrease the intensity of your exercise.  Initial Hamstring Rehab Protocol Hamstring curls: Start with 3 sets of 15 (no weight); Progress by 5 reps every 3 days until you reach 3 sets of 30; After 3 days at 3 sets of 30, add 2lb ankle weight at 3 sets of 10; Increase every 5 days by 5 reps. You may add 2lbs ankle weight once weekly. Hamstring swings- swing leg backwards and curl at the end of the swing. Follow same schedule as above. Hamstring running lunges- running lunge position means no more than 45 degrees of knee flexion and running motion. Follow same schedule as above.  

## 2012-04-21 LAB — COMPREHENSIVE METABOLIC PANEL WITH GFR
ALT: 14 U/L (ref 0–35)
Alkaline Phosphatase: 39 U/L (ref 39–117)
Creat: 0.63 mg/dL (ref 0.50–1.10)
Glucose, Bld: 90 mg/dL (ref 70–99)
Sodium: 139 meq/L (ref 135–145)
Total Bilirubin: 0.5 mg/dL (ref 0.3–1.2)
Total Protein: 7 g/dL (ref 6.0–8.3)

## 2012-04-21 LAB — CBC
HCT: 40.1 % (ref 36.0–46.0)
Hemoglobin: 13.9 g/dL (ref 12.0–15.0)
MCH: 31.1 pg (ref 26.0–34.0)
MCHC: 34.7 g/dL (ref 30.0–36.0)
MCV: 89.7 fL (ref 78.0–100.0)
Platelets: 249 K/uL (ref 150–400)
RBC: 4.47 MIL/uL (ref 3.87–5.11)
RDW: 13.2 % (ref 11.5–15.5)
WBC: 4.1 10*3/uL (ref 4.0–10.5)

## 2012-04-21 LAB — COMPREHENSIVE METABOLIC PANEL
AST: 8 U/L (ref 0–37)
Albumin: 4.4 g/dL (ref 3.5–5.2)
BUN: 8 mg/dL (ref 6–23)
CO2: 25 mEq/L (ref 19–32)
Calcium: 9.8 mg/dL (ref 8.4–10.5)
Chloride: 107 mEq/L (ref 96–112)
Potassium: 4.4 mEq/L (ref 3.5–5.3)

## 2012-04-21 LAB — LIPID PANEL
Cholesterol: 167 mg/dL (ref 0–200)
HDL: 54 mg/dL (ref >39)
LDL Cholesterol: 96 mg/dL (ref 0–99)
Total CHOL/HDL Ratio: 3.1 ratio
Triglycerides: 85 mg/dL (ref <150)
VLDL: 17 mg/dL (ref 0–40)

## 2012-04-21 LAB — TSH: TSH: 2.551 u[IU]/mL (ref 0.350–4.500)

## 2012-05-18 ENCOUNTER — Encounter: Payer: Self-pay | Admitting: Sports Medicine

## 2012-05-18 ENCOUNTER — Ambulatory Visit (INDEPENDENT_AMBULATORY_CARE_PROVIDER_SITE_OTHER): Payer: Managed Care, Other (non HMO) | Admitting: Sports Medicine

## 2012-05-18 VITALS — BP 136/82 | HR 98 | Ht 64.0 in | Wt 139.0 lb

## 2012-05-18 DIAGNOSIS — S838X9A Sprain of other specified parts of unspecified knee, initial encounter: Secondary | ICD-10-CM

## 2012-05-18 DIAGNOSIS — F411 Generalized anxiety disorder: Secondary | ICD-10-CM

## 2012-05-18 DIAGNOSIS — F419 Anxiety disorder, unspecified: Secondary | ICD-10-CM

## 2012-05-18 DIAGNOSIS — S86119A Strain of other muscle(s) and tendon(s) of posterior muscle group at lower leg level, unspecified leg, initial encounter: Secondary | ICD-10-CM

## 2012-05-18 DIAGNOSIS — R635 Abnormal weight gain: Secondary | ICD-10-CM

## 2012-05-18 NOTE — Patient Instructions (Addendum)
[email or web address]  Decrease Lexapro to 10 mg daily for one week, then stop. Come back to see me in  2 weeks to discuss.  Heel lift, and calf exercises.

## 2012-05-18 NOTE — Assessment & Plan Note (Signed)
She has in fact lost several pounds. She would like to go off of the SSRI in an effort to lose more weight. I did recommend dietary strategies, and she will try   low glycemicfoods. She may also try  orlistat over-the-counter

## 2012-05-18 NOTE — Progress Notes (Signed)
Subjective:    CC: Followup  HPI:  Rebecca Roach comes back to see [place] to discuss several issues.  Anxiety: She does desire to come off of Lexapro. She notes that her anxiety, and mood has been fairly good lately. She does attribute some of her symptoms to difficulties with her husband. She is currently off all of benzodiazepines.  Weight gain: She has lost several pounds, she does believe the Lexapro is contributing to her difficulty losing weight, however she does also endorsed eating intermittently, and unhealthy. She is exercising more.  Metabolic panel, complete blood count, TSH were all normal.  Left calf pain: Has not been doing her rehabilitation exercises, and pain is essentially the same.  She still localizes it to the musculotendinous junction of the left gastrocnemius. It is localized, doesn't radiate, it is mild.   Past medical history, Surgical history, Family history, Social history, Allergies, and medications have been entered into the medical record, reviewed, and no changes needed.   Review of Systems: No fevers, chills, night sweats, weight loss, chest pain, or shortness of breath.   Objective:    General: Well Developed, well nourished, and in no acute distress.  Neuro: Alert and oriented x3, extra-ocular muscles intact.  HEENT: Normocephalic, atraumatic, pupils equal round reactive to light, neck supple, no masses, no lymphadenopathy, thyroid nonpalpable.  Skin: Warm and dry, no rashes. Cardiac: Regular rate and rhythm, no murmurs rubs or gallops.  Respiratory: Clear to auscultation bilaterally. Not using accessory muscles, speaking in full sentences. Left Knee: Normal to inspection with no erythema or effusion or obvious bony abnormalities. Palpation normal with no warmth, joint line tenderness, patellar tenderness, or condyle tenderness. ROM full in flexion and extension and lower leg rotation. Ligaments with solid consistent endpoints including ACL, PCL, LCL,  MCL. Negative Mcmurray's, Apley's, and Thessalonian tests. Non painful patellar compression. Patellar glide without crepitus. Patellar and quadriceps tendons unremarkable. Hamstring and quadriceps strength is normal.   I strapped her gastrocnemius with elastic bandage. We also placed a heel lift in her left shoe.  Impression and Recommendations:    I spent over 40 minutes with this patient, greater than 50% was face-to-face counseling regarding all of the above problems.

## 2012-05-18 NOTE — Assessment & Plan Note (Signed)
Ace wrap on catheter. Heel lift. Continue rehabilitation exercises and Mobic

## 2012-05-18 NOTE — Assessment & Plan Note (Signed)
We can try a down taper off of the Lexapro. We will do 10 mg for one week, then stop. I would like to see her back, and we can consider buspirone if no better or worsening.

## 2012-06-03 ENCOUNTER — Encounter: Payer: Self-pay | Admitting: Sports Medicine

## 2012-06-03 ENCOUNTER — Ambulatory Visit (INDEPENDENT_AMBULATORY_CARE_PROVIDER_SITE_OTHER): Payer: Managed Care, Other (non HMO) | Admitting: Sports Medicine

## 2012-06-03 VITALS — BP 142/84 | HR 96 | Wt 141.0 lb

## 2012-06-03 DIAGNOSIS — S86119A Strain of other muscle(s) and tendon(s) of posterior muscle group at lower leg level, unspecified leg, initial encounter: Secondary | ICD-10-CM

## 2012-06-03 DIAGNOSIS — F411 Generalized anxiety disorder: Secondary | ICD-10-CM

## 2012-06-03 DIAGNOSIS — F419 Anxiety disorder, unspecified: Secondary | ICD-10-CM

## 2012-06-03 DIAGNOSIS — Z299 Encounter for prophylactic measures, unspecified: Secondary | ICD-10-CM

## 2012-06-03 DIAGNOSIS — R635 Abnormal weight gain: Secondary | ICD-10-CM

## 2012-06-03 NOTE — Assessment & Plan Note (Signed)
Up to date

## 2012-06-03 NOTE — Assessment & Plan Note (Signed)
Improving but not really doing rehabilitation exercises.

## 2012-06-03 NOTE — Assessment & Plan Note (Signed)
2 pounds up but has recently going through the holidays.

## 2012-06-03 NOTE — Assessment & Plan Note (Signed)
Currently off Lexapro for approximately one week, doing okay.

## 2012-06-03 NOTE — Progress Notes (Signed)
Subjective:    CC: Followup  HPI: Anxiety: Rebecca Roach has recently come off Lexapro, Rebecca Roach is currently studying for one of her exams. Overall Rebecca Roach notes improvement in her anxiety level, and remains good. Rebecca Roach denies any problems with sleep, appetite, or interest in daily activities. Some of the issues with her husband are beginning to resolve with communication, and they have been reading the Bible more often.  Weight gain: Rebecca Roach did put on 2 pounds since last visit, Rebecca Roach does attribute this to dietary indiscretions over the holidays.  Calf strain: Improving, but Rebecca Roach has been inconsistent with rehabilitation. Rebecca Roach will work a little bit more aggressively on this.  Past medical history, Surgical history, Family history, Social history, Allergies, and medications have been entered into the medical record, reviewed, and no changes needed.   Review of Systems: No fevers, chills, night sweats, weight loss, chest pain, or shortness of breath.   Objective:    General: Well Developed, well nourished, and in no acute distress.  Neuro: Alert and oriented x3, extra-ocular muscles intact.  HEENT: Normocephalic, atraumatic, pupils equal round reactive to light, neck supple, no masses, no lymphadenopathy, thyroid nonpalpable.  Skin: Warm and dry, no rashes. Cardiac: Regular rate and rhythm, no murmurs rubs or gallops.  Respiratory: Clear to auscultation bilaterally. Not using accessory muscles, speaking in full sentences.  Impression and Recommendations:

## 2012-06-06 ENCOUNTER — Encounter: Payer: Self-pay | Admitting: Sports Medicine

## 2012-06-08 NOTE — L&D Delivery Note (Signed)
Operative Delivery Note Pt reached complete dilation and pushed for 2 hours bringing the vertex to a +2-+3 station.  She became exhausted and began to make no further progress.  We discussed options of continuing to push vs proceeding with vacuum assistance.  The foley catheter was removed and the Kiwi vacuum attempted to be placed on the vertex.  The vaginal septum that was noted earlier was very thin from pushing and transected in the midline (it ran from just under the urethra to the inner left sidewall).  The suction would not apply, so this was traded for the M-cup vacuum instead.  The vacuum was applied on 2 separate sets of contractions in the green zone and the vertex delivered to crowning.  There were no true pop-offs, but suction was lost at the end of the first pull as the vertex came close to the introitus.  .At 1:22 AM a healthy female was delivered via Vaginal, Vacuum Investment banker, operational).  Presentation: vertex; Position: Right,, Occiput,, Anterior; Station: +2.  Verbal consent: obtained from patient.  Risks and benefits discussed in detail.  Risks include, but are not limited to the risks of anesthesia, bleeding, infection, damage to maternal tissues, fetal cephalhematoma.  There is also the risk of inability to effect vaginal delivery of the head, or shoulder dystocia that cannot be resolved by established maneuvers, leading to the need for emergency cesarean section.  APGAR: 9, 9; weight pending .   Placenta status: Intact, Spontaneous.   Cord: 3 vessels with the following complications: corporal x 1  Anesthesia: Epidural  Instruments: M-cup vacuum Episiotomy: None Lacerations: 2nd degree, repair of transected septum Suture Repair: 3.0 vicryl rapide Est. Blood Loss (mL): 400cc  Mom to postpartum.  Baby to stay with mother skin-to-skin. After delivery, sponge count was performed and one small sponge missing.  Trash was searched, the bag searched, the patient was examined vaginally and with  fundal massage and no sponge found.  I was satisfied the sponge was not in the patient's vagina and exhausted areas to look. THe RN will look under the patient when she is moved.   Oliver Pila 03/29/2013, 2:06 AM

## 2012-08-23 ENCOUNTER — Encounter: Payer: Self-pay | Admitting: Sports Medicine

## 2012-08-24 ENCOUNTER — Encounter: Payer: Self-pay | Admitting: *Deleted

## 2012-08-31 ENCOUNTER — Ambulatory Visit (INDEPENDENT_AMBULATORY_CARE_PROVIDER_SITE_OTHER): Payer: Managed Care, Other (non HMO) | Admitting: Sports Medicine

## 2012-08-31 ENCOUNTER — Encounter: Payer: Self-pay | Admitting: Sports Medicine

## 2012-08-31 VITALS — BP 159/80 | HR 99 | Wt 148.0 lb

## 2012-08-31 DIAGNOSIS — Z299 Encounter for prophylactic measures, unspecified: Secondary | ICD-10-CM

## 2012-08-31 DIAGNOSIS — Z3490 Encounter for supervision of normal pregnancy, unspecified, unspecified trimester: Secondary | ICD-10-CM | POA: Insufficient documentation

## 2012-08-31 DIAGNOSIS — S86112D Strain of other muscle(s) and tendon(s) of posterior muscle group at lower leg level, left leg, subsequent encounter: Secondary | ICD-10-CM

## 2012-08-31 DIAGNOSIS — J0100 Acute maxillary sinusitis, unspecified: Secondary | ICD-10-CM

## 2012-08-31 MED ORDER — BUDESONIDE 32 MCG/ACT NA SUSP: 1.0000 | Freq: Every day | NASAL | Status: DC

## 2012-08-31 MED ORDER — AMOXICILLIN-POT CLAVULANATE 875-125 MG PO TABS: 1.0000 | ORAL_TABLET | Freq: Two times a day (BID) | ORAL | Status: DC

## 2012-08-31 NOTE — Assessment & Plan Note (Signed)
Continue care with [facility], Dr. Ambrose Mantle.

## 2012-08-31 NOTE — Progress Notes (Signed)
Subjective:    CC: Followup  HPI: Anxiety: Is now off of Lexapro and doing well.  Pregnancy: 9 weeks, [facility] will be her prenatal care provider.  Calf strain: Is now describing symptoms predominantly at night with an urge to move her legs through the night. She has already tried heel lifts, physical therapy, wraps, NSAIDs. She has of course stopped the NSAIDs due to her pregnancy.  Past medical history, Surgical history, Family history not pertinant except as noted below, Social history, Allergies, and medications have been entered into the medical record, reviewed, and no changes needed.   Review of Systems: No fevers, chills, night sweats, weight loss, chest pain, or shortness of breath.   Objective:    General: Well Developed, well nourished, and in no acute distress.  Neuro: Alert and oriented x3, extra-ocular muscles intact, sensation grossly intact.  HEENT: Normocephalic, atraumatic, pupils equal round reactive to light, neck supple, no masses, no lymphadenopathy, thyroid nonpalpable.  Skin: Warm and dry, no rashes. Cardiac: Regular rate and rhythm, no murmurs rubs or gallops.  Respiratory: Clear to auscultation bilaterally. Not using accessory muscles, speaking in full sentences. Left Knee: Normal to inspection with no erythema or effusion or obvious bony abnormalities. Palpation normal with no warmth, joint line tenderness, patellar tenderness, or condyle tenderness. ROM full in flexion and extension and lower leg rotation. Ligaments with solid consistent endpoints including ACL, PCL, LCL, MCL. Negative Mcmurray's, Apley's, and Thessalonian tests. Non painful patellar compression. Patellar glide without crepitus. Patellar and quadriceps tendons unremarkable. Hamstring and quadriceps strength is normal.  Only minimal if any tenderness to palpation over the gastroc, lateral head proximally.  Impression and Recommendations:

## 2012-08-31 NOTE — Assessment & Plan Note (Signed)
I am concerned that some of her symptoms may represent restless legs syndrome. I have asked her to do iron supplementation twice a day. We will await her pregnancy blood screening which will include a CBC.

## 2012-08-31 NOTE — Assessment & Plan Note (Signed)
Augmentin, Flonase. 

## 2012-08-31 NOTE — Assessment & Plan Note (Signed)
Needs TB screening for work, ordering quantiferon Gold.

## 2012-09-01 ENCOUNTER — Ambulatory Visit: Payer: Managed Care, Other (non HMO) | Admitting: Sports Medicine

## 2012-09-26 ENCOUNTER — Encounter: Payer: Self-pay | Admitting: *Deleted

## 2012-09-26 ENCOUNTER — Telehealth: Payer: Self-pay | Admitting: *Deleted

## 2012-09-26 NOTE — Telephone Encounter (Signed)
Switch to Allegra, this is over-the-counter, should use 180 mg daily. If she had any more of her nasal steroid? If not, I can call in Flonase. Either way, this should be used twice a day, every day, whether symptoms are bad or not.

## 2012-09-26 NOTE — Telephone Encounter (Signed)
Pt states taking her Singulair everyday and her allergies are really flaring right now with pollen being so high. Still having headaches at night and waking up with them. Wants to know if there is anything she can take with Singulair to help. Is pregnant as well. Called her GYN and they told her to call her PCP. Took Claritin without any relief as well. Barry Dienes, LPN

## 2012-09-27 ENCOUNTER — Telehealth: Payer: Self-pay | Admitting: *Deleted

## 2012-09-27 MED ORDER — FLUTICASONE PROPIONATE 50 MCG/ACT NA SUSP: NASAL | Status: DC

## 2012-09-27 NOTE — Telephone Encounter (Signed)
Pt is ok with sending in flonase (generic)to [facility] on [address]

## 2012-09-27 NOTE — Telephone Encounter (Signed)
LMOM notifying pt. 

## 2012-09-27 NOTE — Telephone Encounter (Signed)
Done

## 2012-09-30 LAB — OB RESULTS CONSOLE ANTIBODY SCREEN: Antibody Screen: NEGATIVE

## 2012-09-30 LAB — OB RESULTS CONSOLE HIV ANTIBODY (ROUTINE TESTING): HIV: NONREACTIVE

## 2012-09-30 LAB — OB RESULTS CONSOLE ABO/RH: RH Type: POSITIVE

## 2012-09-30 LAB — OB RESULTS CONSOLE GC/CHLAMYDIA: Chlamydia: NEGATIVE

## 2012-09-30 LAB — OB RESULTS CONSOLE GBS: GBS: POSITIVE

## 2012-11-01 ENCOUNTER — Ambulatory Visit (INDEPENDENT_AMBULATORY_CARE_PROVIDER_SITE_OTHER): Payer: Managed Care, Other (non HMO) | Admitting: Sports Medicine

## 2012-11-01 ENCOUNTER — Encounter: Payer: Self-pay | Admitting: Sports Medicine

## 2012-11-01 VITALS — BP 154/79 | HR 123 | Wt 159.0 lb

## 2012-11-01 DIAGNOSIS — T148 Other injury of unspecified body region: Secondary | ICD-10-CM

## 2012-11-01 DIAGNOSIS — M25569 Pain in unspecified knee: Secondary | ICD-10-CM

## 2012-11-01 DIAGNOSIS — Z299 Encounter for prophylactic measures, unspecified: Secondary | ICD-10-CM

## 2012-11-01 DIAGNOSIS — Z111 Encounter for screening for respiratory tuberculosis: Secondary | ICD-10-CM

## 2012-11-01 DIAGNOSIS — Z3490 Encounter for supervision of normal pregnancy, unspecified, unspecified trimester: Secondary | ICD-10-CM

## 2012-11-01 DIAGNOSIS — W57XXXA Bitten or stung by nonvenomous insect and other nonvenomous arthropods, initial encounter: Secondary | ICD-10-CM | POA: Insufficient documentation

## 2012-11-01 DIAGNOSIS — Z23 Encounter for immunization: Secondary | ICD-10-CM

## 2012-11-01 DIAGNOSIS — Z331 Pregnant state, incidental: Secondary | ICD-10-CM

## 2012-11-01 MED ORDER — CEPHALEXIN 500 MG PO CAPS: 500.0000 mg | ORAL_CAPSULE | Freq: Two times a day (BID) | ORAL | Status: DC

## 2012-11-01 MED ORDER — TRIAMCINOLONE ACETONIDE 0.5 % EX CREA: TOPICAL_CREAM | Freq: Two times a day (BID) | CUTANEOUS | Status: DC

## 2012-11-01 NOTE — Assessment & Plan Note (Signed)
PPD planted today per patient request.

## 2012-11-01 NOTE — Assessment & Plan Note (Addendum)
Doing well. She will followup with her OB/GYN regarding her elevated blood pressure today. Thinking of making [place] her child's pediatricians.

## 2012-11-01 NOTE — Assessment & Plan Note (Signed)
She is not anemic, but I do think symptoms now represent restless leg syndrome. I'm going to wait for her pregnancy has concluded before treating her.

## 2012-11-01 NOTE — Assessment & Plan Note (Signed)
Triamcinolone cream. Keflex. Doing tickborne antigen testing. Return in a week to recheck rash.

## 2012-11-01 NOTE — Progress Notes (Signed)
  Subjective:    CC: Tick bite  HPI: Tick bite: Occurred yesterday, she was able to remove the tick, is unsure how long it's been attached. Since then, no constitutional symptoms, fevers, chills, joint aches, she does have an itchy rash that is now present over the bite site.  Bilateral leg pain: Seems to be restless leg syndrome, she agrees to defer treatment until after her pregnancy.  Pregnancy: Currently 17 weeks, finds out sex of the baby next week.  She sees Dr. Ambrose Mantle is her OB/GYN, and wants to make [place] her child's pediatricians.  She does have an elevated blood pressure today, but notes that her blood pressure is normal at every other visit. No headaches, visual changes.  Past medical history, Surgical history, Family history not pertinant except as noted below, Social history, Allergies, and medications have been entered into the medical record, reviewed, and no changes needed.   Review of Systems: No fevers, chills, night sweats, weight loss, chest pain, or shortness of breath.   Objective:    General: Well Developed, well nourished, and in no acute distress.  Neuro: Alert and oriented x3, extra-ocular muscles intact, sensation grossly intact.  HEENT: Normocephalic, atraumatic, pupils equal round reactive to light, neck supple, no masses, no lymphadenopathy, thyroid nonpalpable.  Skin: Warm and dry, no rashes.  There is a circular rash around the tick bite, it is mildly indurated. Cardiac: Regular rate and rhythm, no murmurs rubs or gallops, no lower extremity edema.  Respiratory: Clear to auscultation bilaterally. Not using accessory muscles, speaking in full sentences. Impression and Recommendations:

## 2012-11-02 LAB — ROCKY MTN SPOTTED FVR ABS PNL(IGG+IGM)
RMSF IgG: 0.56 IV
RMSF IgM: 0.39 IV

## 2012-11-02 LAB — EHRLICHIA ANTIBODY PANEL
E chaffeensis (HGE) Ab, IgG: NEGATIVE
E chaffeensis (HGE) Ab, IgM: NEGATIVE

## 2012-11-02 LAB — B. BURGDORFI ANTIBODIES: B burgdorferi Ab IgG+IgM: 0.7 {ISR}

## 2012-11-04 LAB — TB SKIN TEST: TB Skin Test: NEGATIVE

## 2012-11-08 ENCOUNTER — Encounter: Payer: Self-pay | Admitting: Sports Medicine

## 2012-11-08 ENCOUNTER — Ambulatory Visit (INDEPENDENT_AMBULATORY_CARE_PROVIDER_SITE_OTHER): Payer: Managed Care, Other (non HMO) | Admitting: Sports Medicine

## 2012-11-08 VITALS — BP 146/76 | HR 120

## 2012-11-08 DIAGNOSIS — R030 Elevated blood-pressure reading, without diagnosis of hypertension: Secondary | ICD-10-CM

## 2012-11-08 DIAGNOSIS — M25562 Pain in left knee: Secondary | ICD-10-CM

## 2012-11-08 DIAGNOSIS — W57XXXA Bitten or stung by nonvenomous insect and other nonvenomous arthropods, initial encounter: Secondary | ICD-10-CM

## 2012-11-08 DIAGNOSIS — T148 Other injury of unspecified body region: Secondary | ICD-10-CM

## 2012-11-08 DIAGNOSIS — M25569 Pain in unspecified knee: Secondary | ICD-10-CM

## 2012-11-08 MED ORDER — MAGNESIUM OXIDE 400 MG PO TABS: 800.0000 mg | ORAL_TABLET | Freq: Every day | ORAL | Status: DC

## 2012-11-08 MED ORDER — FERROUS SULFATE 325 (65 FE) MG PO TBEC: 325.0000 mg | DELAYED_RELEASE_TABLET | Freq: Three times a day (TID) | ORAL | Status: DC

## 2012-11-08 NOTE — Assessment & Plan Note (Signed)
Continues to improve after using Keflex and topical Kenalog.

## 2012-11-08 NOTE — Progress Notes (Signed)
  Subjective:    CC: Followup  HPI: Rash: After tick bite, finished 7 days of Keflex, is also doing very well with Kenalog cream. Symptoms are improving significantly. Tickborne titers were negative.  Left leg pain: Likely related to chronic gastroc strain, has not been as compliant with exercises as she should be.  Is also possible that this represents restless leg syndrome, she initially wanted to defer treatment until after pregnancy. Pain continues to be localized at the musculotendinous junction of the gastroc on the left side, no swelling.  Elevated blood pressure: Notes this only happens when she comes to this office, blood pressure has been well-controlled on all of her visits with her OB/GYN, she's never had proteinuria either. Denies any headaches, visual changes, right upper quadrant pain.  Past medical history, Surgical history, Family history not pertinant except as noted below, Social history, Allergies, and medications have been entered into the medical record, reviewed, and no changes needed.   Review of Systems: No fevers, chills, night sweats, weight loss, chest pain, or shortness of breath.   Objective:    General: Well Developed, well nourished, and in no acute distress.  Neuro: Alert and oriented x3, extra-ocular muscles intact, sensation grossly intact.  HEENT: Normocephalic, atraumatic, pupils equal round reactive to light, neck supple, no masses, no lymphadenopathy, thyroid nonpalpable.  Skin: Warm and dry, rash on left torso is almost completely resolved. Cardiac: Regular rate and rhythm, no murmurs rubs or gallops, no lower extremity edema.  Respiratory: Clear to auscultation bilaterally. Not using accessory muscles, speaking in full sentences. Impression and Recommendations:

## 2012-11-08 NOTE — Assessment & Plan Note (Signed)
Pain continues to be located at the musculotendinous junction of the left calf. This may be related to restless leg syndrome, as it is worse at night, however there is no effective treatment that is safe during pregnancy. I will add magnesium oxide 800 mg at bedtime, and iron supplementation. Come back to see me in a couple months.

## 2012-11-08 NOTE — Assessment & Plan Note (Signed)
This is likely related to white coat hypertension. She has no signs of preeclampsia, and blood pressures are always well controlled with her OB/GYN.

## 2012-11-18 ENCOUNTER — Telehealth: Payer: Self-pay | Admitting: *Deleted

## 2012-11-18 NOTE — Telephone Encounter (Signed)
Pt ask to let you know that she stopped taking the iron and magnesium.

## 2012-12-20 ENCOUNTER — Ambulatory Visit: Payer: Managed Care, Other (non HMO) | Admitting: Sports Medicine

## 2013-02-02 ENCOUNTER — Other Ambulatory Visit: Payer: Self-pay | Admitting: Obstetrics and Gynecology

## 2013-02-02 ENCOUNTER — Other Ambulatory Visit (HOSPITAL_COMMUNITY): Payer: Self-pay | Admitting: Obstetrics and Gynecology

## 2013-02-02 DIAGNOSIS — M25569 Pain in unspecified knee: Secondary | ICD-10-CM

## 2013-02-03 ENCOUNTER — Ambulatory Visit (HOSPITAL_COMMUNITY): Payer: Managed Care, Other (non HMO)

## 2013-03-28 ENCOUNTER — Inpatient Hospital Stay (HOSPITAL_COMMUNITY): Payer: Managed Care, Other (non HMO) | Admitting: Anesthesiology

## 2013-03-28 ENCOUNTER — Encounter (HOSPITAL_COMMUNITY): Payer: Managed Care, Other (non HMO) | Admitting: Anesthesiology

## 2013-03-28 ENCOUNTER — Inpatient Hospital Stay (HOSPITAL_COMMUNITY)
Admission: AD | Admit: 2013-03-28 | Discharge: 2013-03-30 | DRG: 775 | Disposition: A | Discharge status: Home / Self Care | Payer: Managed Care, Other (non HMO) | Source: Ambulatory Visit | Attending: Obstetrics and Gynecology | Admitting: Obstetrics and Gynecology

## 2013-03-28 ENCOUNTER — Encounter (HOSPITAL_COMMUNITY): Payer: Self-pay

## 2013-03-28 DIAGNOSIS — Q5211 Transverse vaginal septum: Secondary | ICD-10-CM

## 2013-03-28 DIAGNOSIS — O99892 Other specified diseases and conditions complicating childbirth: Secondary | ICD-10-CM | POA: Diagnosis present

## 2013-03-28 DIAGNOSIS — O139 Gestational [pregnancy-induced] hypertension without significant proteinuria, unspecified trimester: Principal | ICD-10-CM | POA: Diagnosis present

## 2013-03-28 DIAGNOSIS — Z22330 Carrier of Group B streptococcus: Secondary | ICD-10-CM

## 2013-03-28 DIAGNOSIS — O3460 Maternal care for abnormality of vagina, unspecified trimester: Secondary | ICD-10-CM | POA: Diagnosis present

## 2013-03-28 HISTORY — DX: Essential (primary) hypertension: I10

## 2013-03-28 LAB — CBC
MCH: 32.1 pg (ref 26.0–34.0)
MCV: 89.2 fL (ref 78.0–100.0)
Platelets: 210 10*3/uL (ref 150–400)
RBC: 4.17 MIL/uL (ref 3.87–5.11)
RDW: 13.2 % (ref 11.5–15.5)

## 2013-03-28 MED ORDER — EPHEDRINE 5 MG/ML INJ
10.0000 mg | INTRAVENOUS | Status: DC | PRN
  Filled 2013-03-28: qty 2
  Filled 2013-03-28: qty 4

## 2013-03-28 MED ORDER — DIPHENHYDRAMINE HCL 50 MG/ML IJ SOLN: 12.5000 mg | INTRAMUSCULAR | Status: DC | PRN

## 2013-03-28 MED ORDER — PENICILLIN G POTASSIUM 5000000 UNITS IJ SOLR
5.0000 10*6.[IU] | Freq: Once | INTRAVENOUS | Status: AC
  Administered 2013-03-28: 5 10*6.[IU] via INTRAVENOUS
  Filled 2013-03-28: qty 5

## 2013-03-28 MED ORDER — CITRIC ACID-SODIUM CITRATE 334-500 MG/5ML PO SOLN: 30.0000 mL | ORAL | Status: DC | PRN

## 2013-03-28 MED ORDER — OXYTOCIN 40 UNITS IN LACTATED RINGERS INFUSION - SIMPLE MED: 62.5000 mL/h | INTRAVENOUS | Status: DC

## 2013-03-28 MED ORDER — LACTATED RINGERS IV SOLN: 500.0000 mL | Freq: Once | INTRAVENOUS | Status: DC

## 2013-03-28 MED ORDER — ACETAMINOPHEN 325 MG PO TABS
650.0000 mg | ORAL_TABLET | ORAL | Status: DC | PRN
Start: 2013-03-28 — End: 2013-03-29

## 2013-03-28 MED ORDER — LACTATED RINGERS IV SOLN
INTRAVENOUS | Status: DC
  Administered 2013-03-28: 16:00:00 via INTRAVENOUS

## 2013-03-28 MED ORDER — EPHEDRINE 5 MG/ML INJ
10.0000 mg | INTRAVENOUS | Status: DC | PRN
  Filled 2013-03-28: qty 2

## 2013-03-28 MED ORDER — TERBUTALINE SULFATE 1 MG/ML IJ SOLN: 0.2500 mg | Freq: Once | INTRAMUSCULAR | Status: AC | PRN

## 2013-03-28 MED ORDER — PHENYLEPHRINE 40 MCG/ML (10ML) SYRINGE FOR IV PUSH (FOR BLOOD PRESSURE SUPPORT)
80.0000 ug | PREFILLED_SYRINGE | INTRAVENOUS | Status: DC | PRN
  Filled 2013-03-28: qty 5
  Filled 2013-03-28: qty 2

## 2013-03-28 MED ORDER — OXYTOCIN BOLUS FROM INFUSION
500.0000 mL | INTRAVENOUS | Status: DC
  Administered 2013-03-29: 500 mL via INTRAVENOUS

## 2013-03-28 MED ORDER — LACTATED RINGERS IV SOLN: 500.0000 mL | INTRAVENOUS | Status: DC | PRN

## 2013-03-28 MED ORDER — FENTANYL 2.5 MCG/ML BUPIVACAINE 1/10 % EPIDURAL INFUSION (WH - ANES)
14.0000 mL/h | INTRAMUSCULAR | Status: DC | PRN
  Administered 2013-03-28: 14 mL/h via EPIDURAL
  Filled 2013-03-28 (×2): qty 125

## 2013-03-28 MED ORDER — FLEET ENEMA 7-19 GM/118ML RE ENEM: 1.0000 | ENEMA | RECTAL | Status: DC | PRN

## 2013-03-28 MED ORDER — ONDANSETRON HCL 4 MG/2ML IJ SOLN: 4.0000 mg | Freq: Four times a day (QID) | INTRAMUSCULAR | Status: DC | PRN

## 2013-03-28 MED ORDER — LIDOCAINE HCL (PF) 1 % IJ SOLN
INTRAMUSCULAR | Status: DC | PRN
  Administered 2013-03-28 (×2): 5 mL

## 2013-03-28 MED ORDER — BUTORPHANOL TARTRATE 1 MG/ML IJ SOLN
1.0000 mg | Freq: Once | INTRAMUSCULAR | Status: AC
  Administered 2013-03-28: 1 mg via INTRAVENOUS
  Filled 2013-03-28: qty 1

## 2013-03-28 MED ORDER — OXYTOCIN 40 UNITS IN LACTATED RINGERS INFUSION - SIMPLE MED
1.0000 m[IU]/min | INTRAVENOUS | Status: DC
  Administered 2013-03-28: 2 m[IU]/min via INTRAVENOUS
  Filled 2013-03-28: qty 1000

## 2013-03-28 MED ORDER — IBUPROFEN 600 MG PO TABS: 600.0000 mg | ORAL_TABLET | Freq: Four times a day (QID) | ORAL | Status: DC | PRN

## 2013-03-28 MED ORDER — PENICILLIN G POTASSIUM 5000000 UNITS IJ SOLR
2.5000 10*6.[IU] | INTRAMUSCULAR | Status: DC
  Administered 2013-03-28 – 2013-03-29 (×3): 2.5 10*6.[IU] via INTRAVENOUS
  Filled 2013-03-28 (×7): qty 2.5

## 2013-03-28 MED ORDER — LIDOCAINE HCL (PF) 1 % IJ SOLN
30.0000 mL | INTRAMUSCULAR | Status: DC | PRN
  Filled 2013-03-28 (×2): qty 30

## 2013-03-28 MED ORDER — PHENYLEPHRINE 40 MCG/ML (10ML) SYRINGE FOR IV PUSH (FOR BLOOD PRESSURE SUPPORT)
80.0000 ug | PREFILLED_SYRINGE | INTRAVENOUS | Status: DC | PRN
  Filled 2013-03-28: qty 2

## 2013-03-28 NOTE — Anesthesia Preprocedure Evaluation (Addendum)
Anesthesia Evaluation  Patient identified by MRN, date of birth, ID band Patient awake    Reviewed: Allergy & Precautions, H&P , Patient's Chart, lab work & pertinent test results  Airway Mallampati: II TM Distance: >3 FB Neck ROM: full    Dental no notable dental hx.    Pulmonary neg pulmonary ROS,  breath sounds clear to auscultation  Pulmonary exam normal       Cardiovascular hypertension, negative cardio ROS  Rhythm:regular Rate:Normal     Neuro/Psych PSYCHIATRIC DISORDERS Anxiety negative neurological ROS  negative psych ROS   GI/Hepatic negative GI ROS, Neg liver ROS,   Endo/Other  negative endocrine ROS  Renal/GU negative Renal ROS     Musculoskeletal   Abdominal   Peds  Hematology negative hematology ROS (+)   Anesthesia Other Findings   Reproductive/Obstetrics (+) Pregnancy                          Anesthesia Physical Anesthesia Plan  ASA: III  Anesthesia Plan: Epidural   Post-op Pain Management:    Induction:   Airway Management Planned:   Additional Equipment:   Intra-op Plan:   Post-operative Plan:   Informed Consent: I have reviewed the patients History and Physical, chart, labs and discussed the procedure including the risks, benefits and alternatives for the proposed anesthesia with the patient or authorized representative who has indicated his/her understanding and acceptance.     Plan Discussed with:   Anesthesia Plan Comments:        Anesthesia Quick Evaluation

## 2013-03-28 NOTE — Progress Notes (Signed)
   Subjective: Pt comfortable with epidural  Objective: BP 130/71  Pulse 92  Temp(Src) 98.4 F (36.9 C) (Oral)  Resp 20  Ht 5\' 4"  (1.626 m)  Wt 86.637 kg (191 lb)  BMI 32.77 kg/m2  SpO2 100%  LMP 06/26/2012      FHT:  FHR: 140 bpm, variability: moderate,  accelerations:  Present,  decelerations:  Absent UC:   regular, every 2-3 minutes SVE:   Dilation: 8.5 Effacement (%): 90 Station: 0 Exam by:: Dr Senaida Ores  Labs: Lab Results  Component Value Date   WBC 7.4 03/28/2013   HGB 13.4 03/28/2013   HCT 37.2 03/28/2013   MCV 89.2 03/28/2013   PLT 210 03/28/2013    Assessment / Plan: Baby still OP, still making progress Twanda Stakes W 03/28/2013, 8:59 PM

## 2013-03-28 NOTE — Progress Notes (Signed)
   Subjective: Pt feeling some mild pain with contractions, but better with epidural  Objective: BP 126/70  Pulse 97  Temp(Src) 97.8 F (36.6 C)  Ht 5\' 4"  (1.626 m)  Wt 86.637 kg (191 lb)  BMI 32.77 kg/m2  SpO2 100%  LMP 03/22/2012      FHT:  FHR: 145 bpm, variability: moderate,  accelerations:  Present,  decelerations:  Present occcasional variable decels UC:   regular, every 3 minutes SVE:   Dilation: 6.5 Effacement (%): 90 Station: -1 Exam by:: Willy Pinkerton  prob OP IUPC placed  Labs: Lab Results  Component Value Date   WBC 7.4 03/28/2013   HGB 13.4 03/28/2013   HCT 37.2 03/28/2013   MCV 89.2 03/28/2013   PLT 210 03/28/2013    Assessment / Plan: Pt making progress, OP currently Uva Runkel W 03/28/2013, 6:49 PM

## 2013-03-28 NOTE — H&P (Signed)
Rebecca Roach is a 27 y.o. female G2P0010 at 38+ weeks (EDD 04/06/13 by 9 week [place])  presenting for IOL for pregnancy induced hypertension.  Pt has been followed since 36 weeks with BP elevated to 130/90's and on exam today slightly higher at 140/100.  No proteinuria, no preeclampsia symptoms.  Pregnancy otherwise significant for +GBS.  Was measured at S<D and [place] 9/29 showed baby at the 15%ile, normal AFi.  Maternal Medical History:  Contractions: Frequency: rare.   Perceived severity is mild.    Fetal activity: Perceived fetal activity is normal.    Prenatal complications: PIH.   Prenatal Complications - Diabetes: none.    OB History   Grav Para Term Preterm Abortions TAB SAB Ect Mult Living   2             SAB x 1  Past Medical History  Diagnosis Date  . Anxiety   . Cyst of left ovary    Past Surgical History  Procedure Laterality Date  . Mouth surgery      gum   Family History: family history includes Cancer in her maternal grandfather and paternal aunt; Diabetes in her maternal grandfather, mother, paternal grandfather, and paternal grandmother; Hyperlipidemia in her maternal grandmother and mother. There is no history of Anesthesia problems, Malignant hyperthermia, Hypotension, Pseudochol deficiency, or Hearing loss. Social History:  reports that she has never smoked. She does not have any smokeless tobacco history on file. She reports that she does not drink alcohol or use illicit drugs.   Prenatal Transfer Tool  Maternal Diabetes: No Genetic Screening: Normal Maternal Ultrasounds/Referrals: Normal Fetal Ultrasounds or other Referrals:  None Maternal Substance Abuse:  No Significant Maternal Medications:  None Significant Maternal Lab Results:  Lab values include: Group B Strep positive Other Comments:  None  Review of Systems  Gastrointestinal: Negative for abdominal pain.  Neurological: Negative for headaches.    Maternal Exam:  Uterine Assessment:  Contraction strength is mild.  Contraction frequency is irregular.   Abdomen: Patient reports no abdominal tenderness. Fetal presentation: vertex  Introitus: Normal vulva. Normal vagina.    Physical Exam  Constitutional: She is oriented to person, place, and time. She appears well-developed and well-nourished.  Cardiovascular: Normal rate and regular rhythm.   Respiratory: Effort normal and breath sounds normal.  GI: Soft.  Genitourinary: Vagina normal.  Neurological: She is alert and oriented to person, place, and time.  Psychiatric: She has a normal mood and affect. Her behavior is normal.      Cervix 90/3+/-1 AROM clear.  Vaginal band felt just at introitus, more to left.  Probably a hymenal remnant--pt informed, will clip at delivery  Prenatal labs: ABO, Rh:  A positive Antibody:  negative Rubella:  Immune RPR:   Negative HBsAg:   Negative HIV:   Negative GBS:   Positive One hour GTT 114 First trimester screen and AFP WNL  Assessment/Plan: Pt with PIH but no signs/sx of preeclampsia.  Admitted for IOL and already received PCN for +GBS.  On pitocin, and feeling some contractions.  Plans epidural.   Oliver Pila 03/28/2013, 1:26 PM

## 2013-03-28 NOTE — Progress Notes (Signed)
Patient ID: Rebecca Roach, female   DOB: 01-19-1986, 27 y.o.   MRN: [id number] Pt c/o intermittent back pain  afeb vss Cervix c/rim/0 Still OP FHR reassuring  Will recheck in one hour

## 2013-03-28 NOTE — Anesthesia Procedure Notes (Signed)

## 2013-03-29 ENCOUNTER — Encounter (HOSPITAL_COMMUNITY): Payer: Self-pay | Admitting: *Deleted

## 2013-03-29 LAB — CBC
HCT: 36.8 % (ref 36.0–46.0)
Hemoglobin: 12.9 g/dL (ref 12.0–15.0)
MCH: 32.1 pg (ref 26.0–34.0)
MCV: 91.5 fL (ref 78.0–100.0)
RBC: 4.02 MIL/uL (ref 3.87–5.11)
WBC: 15.5 10*3/uL — ABNORMAL HIGH (ref 4.0–10.5)

## 2013-03-29 MED ORDER — ONDANSETRON HCL 4 MG PO TABS: 4.0000 mg | ORAL_TABLET | ORAL | Status: DC | PRN

## 2013-03-29 MED ORDER — LANOLIN HYDROUS EX OINT: TOPICAL_OINTMENT | CUTANEOUS | Status: DC | PRN

## 2013-03-29 MED ORDER — FLUTICASONE PROPIONATE 50 MCG/ACT NA SUSP
1.0000 | Freq: Every day | NASAL | Status: DC
  Administered 2013-03-29 – 2013-03-30 (×2): 1 via NASAL
  Filled 2013-03-29: qty 16

## 2013-03-29 MED ORDER — PRENATAL MULTIVITAMIN CH
1.0000 | ORAL_TABLET | Freq: Every day | ORAL | Status: DC
  Administered 2013-03-29 – 2013-03-30 (×2): 1 via ORAL
  Filled 2013-03-29 (×2): qty 1

## 2013-03-29 MED ORDER — WITCH HAZEL-GLYCERIN EX PADS: 1.0000 "application " | MEDICATED_PAD | CUTANEOUS | Status: DC | PRN

## 2013-03-29 MED ORDER — SENNOSIDES-DOCUSATE SODIUM 8.6-50 MG PO TABS
2.0000 | ORAL_TABLET | ORAL | Status: DC
  Administered 2013-03-30: 2 via ORAL
  Filled 2013-03-29: qty 2

## 2013-03-29 MED ORDER — IBUPROFEN 600 MG PO TABS
600.0000 mg | ORAL_TABLET | Freq: Four times a day (QID) | ORAL | Status: DC
  Administered 2013-03-29 – 2013-03-30 (×6): 600 mg via ORAL
  Filled 2013-03-29 (×6): qty 1

## 2013-03-29 MED ORDER — SIMETHICONE 80 MG PO CHEW: 80.0000 mg | CHEWABLE_TABLET | ORAL | Status: DC | PRN

## 2013-03-29 MED ORDER — TETANUS-DIPHTH-ACELL PERTUSSIS 5-2.5-18.5 LF-MCG/0.5 IM SUSP: 0.5000 mL | Freq: Once | INTRAMUSCULAR | Status: DC

## 2013-03-29 MED ORDER — DIBUCAINE 1 % RE OINT: 1.0000 "application " | TOPICAL_OINTMENT | RECTAL | Status: DC | PRN

## 2013-03-29 MED ORDER — ONDANSETRON HCL 4 MG/2ML IJ SOLN: 4.0000 mg | INTRAMUSCULAR | Status: DC | PRN

## 2013-03-29 MED ORDER — ZOLPIDEM TARTRATE 5 MG PO TABS: 5.0000 mg | ORAL_TABLET | Freq: Every evening | ORAL | Status: DC | PRN

## 2013-03-29 MED ORDER — DIPHENHYDRAMINE HCL 25 MG PO CAPS: 25.0000 mg | ORAL_CAPSULE | Freq: Four times a day (QID) | ORAL | Status: DC | PRN

## 2013-03-29 MED ORDER — BENZOCAINE-MENTHOL 20-0.5 % EX AERO
1.0000 "application " | INHALATION_SPRAY | CUTANEOUS | Status: DC | PRN
  Administered 2013-03-29: 1 via TOPICAL
  Filled 2013-03-29: qty 56

## 2013-03-29 NOTE — Progress Notes (Signed)
Dr Senaida Ores explained risks and benefits of using vacuum for delivery.  Pt verbalized understanding and gave verbal consent.

## 2013-03-29 NOTE — Progress Notes (Signed)
Patient ID: Rebecca Roach, female   DOB: [birth year], 27 y.o.   MRN: [id number] DOD Doing well in postpartum room Baby latching

## 2013-03-29 NOTE — Anesthesia Postprocedure Evaluation (Signed)
  Anesthesia Post-op Note  Patient: Rebecca Roach  Procedure(s) Performed: * No procedures listed *  Patient Location: Mother/Baby  Anesthesia Type:Epidural  Level of Consciousness: awake, alert , oriented and patient cooperative  Airway and Oxygen Therapy: Patient Spontanous Breathing  Post-op Pain: mild  Post-op Assessment: Patient's Cardiovascular Status Stable, Respiratory Function Stable, No headache, No backache, No residual numbness and No residual motor weakness  Post-op Vital Signs: stable  Complications: No apparent anesthesia complications

## 2013-03-29 NOTE — Lactation Note (Signed)
This note was copied from the chart of Rebecca Roach. Lactation Consultation Note  Patient Name: Rebecca Roach [id number] Date: 03/29/2013 Reason for consult: Initial assessment Basic teaching done. Advised to continue to breastfeed with feeding ques, STS when Mom is awake. Cluster feeding reviewed. Lactation brochure left for review. Advised of OP services and support group. Advised to call for assist as needed.   Maternal Data Formula Feeding for Exclusion: No Infant to breast within first hour of birth: Yes Has patient been taught Hand Expression?: Yes Does the patient have breastfeeding experience prior to this delivery?: No  Feeding Feeding Type: Breast Fed Length of feed: 20 min  LATCH Score/Interventions                      Lactation Tools Discussed/Used WIC Program: No   Consult Status Consult Status: Follow-up Date: 03/30/13 Follow-up type: In-patient    Alfred Levins 03/29/2013, 2:17 PM

## 2013-03-30 MED ORDER — ACETAMINOPHEN 325 MG PO TABS
650.0000 mg | ORAL_TABLET | Freq: Four times a day (QID) | ORAL | Status: DC | PRN
  Administered 2013-03-30: 650 mg via ORAL
  Filled 2013-03-30: qty 2

## 2013-03-30 MED ORDER — IBUPROFEN 600 MG PO TABS: 600.0000 mg | ORAL_TABLET | Freq: Four times a day (QID) | ORAL | Status: DC

## 2013-03-30 NOTE — Lactation Note (Signed)
This note was copied from the chart of Rebecca Roach. Lactation Consultation Note  Patient Name: Rebecca Roach [id number] Date: 03/30/2013 Reason for consult: Follow-up assessment;Breast/nipple pain Mom c/o of sore nipples, some redness, excoriation noted. Assisted Mom with positioning and obtaining more depth with latch. She reported improvement in nipple pain.  Care for sore nipples reviewed, Advised to apply EBM, Comfort gels given with instructions. Engorgement care reviewed if needed. Advised of OP services and support group. Reviewed cluster feeding, advised Mom baby should be at the breast 8-12 times or more in 24 hours.   Maternal Data    Feeding Feeding Type: Breast Fed Length of feed: 10 min (did not sustain a latch well)  LATCH Score/Interventions Latch: Grasps breast easily, tongue down, lips flanged, rhythmical sucking. Intervention(s): Skin to skin Intervention(s): Adjust position;Assist with latch;Breast massage;Breast compression  Audible Swallowing: A few with stimulation  Type of Nipple: Everted at rest and after stimulation  Comfort (Breast/Nipple): Filling, red/small blisters or bruises, mild/mod discomfort  Problem noted: Mild/Moderate discomfort;Cracked, bleeding, blisters, bruises Interventions  (Cracked/bleeding/bruising/blister): Expressed breast milk to nipple Interventions (Mild/moderate discomfort): Comfort gels  Hold (Positioning): Assistance needed to correctly position infant at breast and maintain latch. Intervention(s): Breastfeeding basics reviewed;Support Pillows;Position options;Skin to skin  LATCH Score: 7  Lactation Tools Discussed/Used Tools: Comfort gels   Consult Status Consult Status: Complete Date: 03/30/13 Follow-up type: In-patient    Alfred Levins 03/30/2013, 12:51 PM

## 2013-03-30 NOTE — Progress Notes (Signed)
Post Partum Day1 Subjective: no complaints, up ad lib and tolerating PO.  Desires early d/c  Objective: Blood pressure 132/84, pulse 90, temperature 98.1 F (36.7 C), temperature source Oral, resp. rate 18, height 5\' 4"  (1.626 m), weight 86.637 kg (191 lb), last menstrual period 06/26/2012, SpO2 96.00%, unknown if currently breastfeeding.  Physical Exam:  General: alert and cooperative Lochia: appropriate Uterine Fundus: firm    Recent Labs  03/28/13 1205 03/29/13 0210  HGB 13.4 12.9  HCT 37.2 36.8    Assessment/Plan: Discharge home   LOS: 2 days   Rebecca Roach W 03/30/2013, 8:20 AM

## 2013-03-30 NOTE — Discharge Summary (Signed)
Obstetric Discharge Summary Reason for Admission: induction of labor Prenatal Procedures: NST Intrapartum Procedures: vacuum, transection of vaginal septum Postpartum Procedures: none Complications-Operative and Postpartum: second degree perineal laceration Hemoglobin  Date Value Range Status  03/29/2013 12.9  12.0 - 15.0 g/dL Final     HCT  Date Value Range Status  03/29/2013 36.8  36.0 - 46.0 % Final    Physical Exam:  General: alert and cooperative Lochia: appropriate Uterine Fundus: firm   Discharge Diagnoses: Term Pregnancy-delivered                                         PIH  Discharge Information: Date: 03/30/2013 Activity: pelvic rest Diet: routine Medications: Ibuprofen Condition: improved Instructions: refer to practice specific booklet Discharge to: home Follow-up Information   Follow up with Oliver Pila, MD.   Specialty:  Obstetrics and Gynecology   Contact information:   [address], [address] [place] [address] [phone]       Newborn Data: Live born female  Birth Weight: 6 lb 5.2 oz (2869 g) APGAR: 9, 9  Home with mother.  Marigene Erler W 03/30/2013, 9:00 AM

## 2013-03-31 ENCOUNTER — Ambulatory Visit (INDEPENDENT_AMBULATORY_CARE_PROVIDER_SITE_OTHER): Payer: Managed Care, Other (non HMO) | Admitting: Sports Medicine

## 2013-03-31 ENCOUNTER — Ambulatory Visit: Payer: Managed Care, Other (non HMO) | Admitting: Sports Medicine

## 2013-03-31 DIAGNOSIS — Z299 Encounter for prophylactic measures, unspecified: Secondary | ICD-10-CM

## 2013-03-31 DIAGNOSIS — Z23 Encounter for immunization: Secondary | ICD-10-CM

## 2013-03-31 NOTE — Assessment & Plan Note (Signed)
Flu shot as above. 

## 2013-03-31 NOTE — Progress Notes (Signed)
Patient ID: Rebecca Roach, female   DOB: 11/24/1985, 27 y.o.   MRN: [id number] I was present for all essential parts of this visit and procedure. Ihor Austin. Benjamin Stain, M.D.

## 2013-04-03 ENCOUNTER — Ambulatory Visit: Payer: Managed Care, Other (non HMO) | Admitting: Sports Medicine

## 2013-04-05 NOTE — Progress Notes (Signed)
Post discharge chart review completed.  

## 2013-06-05 ENCOUNTER — Telehealth: Payer: Self-pay

## 2013-06-05 NOTE — Telephone Encounter (Signed)
Spoke to patient advised her to take Delsyum OTC as noted below. Embry Huss,CMA

## 2013-06-05 NOTE — Telephone Encounter (Signed)
Over-the-counter dextromethorphan is entirely safe. This comes in the brand-name Delsym.

## 2013-06-05 NOTE — Telephone Encounter (Signed)
Patient called stated that she has a cough she wants to know what she can take OTC while she is nursing?Rosela Supak,CMA

## 2013-06-07 ENCOUNTER — Other Ambulatory Visit: Payer: Self-pay

## 2013-07-20 ENCOUNTER — Ambulatory Visit (INDEPENDENT_AMBULATORY_CARE_PROVIDER_SITE_OTHER): Payer: Managed Care, Other (non HMO) | Admitting: Sports Medicine

## 2013-07-20 ENCOUNTER — Ambulatory Visit (INDEPENDENT_AMBULATORY_CARE_PROVIDER_SITE_OTHER): Payer: Managed Care, Other (non HMO)

## 2013-07-20 ENCOUNTER — Encounter: Payer: Self-pay | Admitting: Sports Medicine

## 2013-07-20 VITALS — BP 128/85 | HR 108 | Wt 160.0 lb

## 2013-07-20 DIAGNOSIS — M25569 Pain in unspecified knee: Secondary | ICD-10-CM

## 2013-07-20 DIAGNOSIS — M79609 Pain in unspecified limb: Secondary | ICD-10-CM

## 2013-07-20 DIAGNOSIS — M549 Dorsalgia, unspecified: Secondary | ICD-10-CM

## 2013-07-20 MED ORDER — GABAPENTIN 300 MG PO CAPS: ORAL_CAPSULE | ORAL | Status: DC

## 2013-07-20 NOTE — Assessment & Plan Note (Signed)
I do think symptoms continue to represent a left-sided S1 radiculopathy. We are going to start conservatively with gabapentin, home rehabilitation exercises, lumbar spine x-rays. Return to see me in about a month, we will consider an MRI for interventional injection planning if no better.

## 2013-07-20 NOTE — Progress Notes (Signed)
  Subjective:    CC: Recheck leg pain  HPI: This pleasant 28 year old female comes back for followup of left leg pain that has been plaguing her now for a year. She is approximately several months postpartum. She does tell me she does get some stiffness in her back, her pain is localized in the posterior left gastrocnemius, we have treated her extensively with calf rehabilitation exercises, NSAIDs, compression dressings, heel lifts. Unfortunately pain is continued. It is particularly bad at night with an urge to move her legs at night. Pain is moderate, persistent.  Past medical history, Surgical history, Family history not pertinant except as noted below, Social history, Allergies, and medications have been entered into the medical record, reviewed, and no changes needed.   Review of Systems: No fevers, chills, night sweats, weight loss, chest pain, or shortness of breath.   Objective:    General: Well Developed, well nourished, and in no acute distress.  Neuro: Alert and oriented x3, extra-ocular muscles intact, sensation grossly intact.  HEENT: Normocephalic, atraumatic, pupils equal round reactive to light, neck supple, no masses, no lymphadenopathy, thyroid nonpalpable.  Skin: Warm and dry, no rashes. Cardiac: Regular rate and rhythm, no murmurs rubs or gallops, no lower extremity edema.  Respiratory: Clear to auscultation bilaterally. Not using accessory muscles, speaking in full sentences. Back Exam:  Inspection: Unremarkable  Motion: Flexion 45 deg, Extension 45 deg, Side Bending to 45 deg bilaterally,  Rotation to 45 deg bilaterally  SLR laying: There is a positive straight leg raise on left side with reproduction of chest pain and radicular symptoms down the left leg in an L5 versus S1 nerve root distribution.  XSLR laying: Negative  Palpable tenderness: None. FABER: negative. Sensory change: Gross sensation intact to all lumbar and sacral dermatomes.  Reflexes: 2+ at both  patellar tendons, 2+ at achilles tendons, Babinski's downgoing.  Strength at foot  Plantar-flexion: 5/5 Dorsi-flexion: 5/5 Eversion: 5/5 Inversion: 5/5  Leg strength  Quad: 5/5 Hamstring: 5/5 Hip flexor: 5/5 Hip abductors: 5/5  Gait unremarkable.  X-rays were reviewed and do show very mild grade 1 retrolisthesis of L5 on S1.  Impression and Recommendations:

## 2013-07-26 ENCOUNTER — Encounter: Payer: Self-pay | Admitting: Sports Medicine

## 2013-07-31 ENCOUNTER — Encounter: Payer: Self-pay | Admitting: Sports Medicine

## 2013-08-18 ENCOUNTER — Ambulatory Visit (INDEPENDENT_AMBULATORY_CARE_PROVIDER_SITE_OTHER): Payer: Managed Care, Other (non HMO) | Admitting: Sports Medicine

## 2013-08-18 ENCOUNTER — Encounter: Payer: Self-pay | Admitting: Sports Medicine

## 2013-08-18 VITALS — BP 134/66 | HR 93 | Ht 64.0 in | Wt 155.0 lb

## 2013-08-18 DIAGNOSIS — M25569 Pain in unspecified knee: Secondary | ICD-10-CM

## 2013-08-18 NOTE — Assessment & Plan Note (Signed)
Symptoms do continue to resemble lumbar radiculitis. Pain is worse when sitting and riding in a car for long periods of time. X-rays did show mild L5 on S1 retrolisthesis. She does desire to proceed with nonpharmacologic approaches.  at this point we are going to obtain an MRI of her lumbar spine.

## 2013-08-18 NOTE — Progress Notes (Signed)
  Subjective:    CC: Follow up  HPI: Left leg pain: We have been trying to diagnose this for many months now, we were interrupted by her gestation, however she continues to have pain running down the back of her left leg, worse when sitting down and riding in a car. No bowel or bladder dysfunction, no changes with Valsalva. We have started with multiple medications but ultimately she has not used them sufficiently, and desires to go with a completely nonpharmacologic approach. Symptoms are mild, persistent.  Past medical history, Surgical history, Family history not pertinant except as noted below, Social history, Allergies, and medications have been entered into the medical record, reviewed, and no changes needed.   Review of Systems: No fevers, chills, night sweats, weight loss, chest pain, or shortness of breath.   Objective:    General: Well Developed, well nourished, and in no acute distress.  Neuro: Alert and oriented x3, extra-ocular muscles intact, sensation grossly intact.  HEENT: Normocephalic, atraumatic, pupils equal round reactive to light, neck supple, no masses, no lymphadenopathy, thyroid nonpalpable.  Skin: Warm and dry, no rashes. Cardiac: Regular rate and rhythm, no murmurs rubs or gallops, no lower extremity edema.  Respiratory: Clear to auscultation bilaterally. Not using accessory muscles, speaking in full sentences.  X-rays were reviewed with the patient, they do show some mild L5 on S1 retrolisthesis.  Impression and Recommendations:

## 2013-08-24 ENCOUNTER — Telehealth: Payer: Self-pay | Admitting: *Deleted

## 2013-08-24 NOTE — Telephone Encounter (Signed)
PA obtained for MRI Lumbar w/o contrast. Auth # [id number].  Meyer CoryMisty Ahmad, LPN

## 2013-09-08 ENCOUNTER — Ambulatory Visit
Admission: RE | Admit: 2013-09-08 | Discharge: 2013-09-08 | Disposition: A | Discharge status: Home / Self Care | Payer: Managed Care, Other (non HMO) | Source: Ambulatory Visit | Attending: Sports Medicine | Admitting: Sports Medicine

## 2013-09-08 DIAGNOSIS — M25569 Pain in unspecified knee: Secondary | ICD-10-CM

## 2013-09-29 ENCOUNTER — Ambulatory Visit: Payer: Managed Care, Other (non HMO) | Admitting: Sports Medicine

## 2013-10-03 ENCOUNTER — Other Ambulatory Visit: Payer: Self-pay | Admitting: Sports Medicine

## 2013-10-03 ENCOUNTER — Encounter: Payer: Self-pay | Admitting: Sports Medicine

## 2013-10-06 ENCOUNTER — Ambulatory Visit (INDEPENDENT_AMBULATORY_CARE_PROVIDER_SITE_OTHER): Payer: Managed Care, Other (non HMO) | Admitting: Sports Medicine

## 2013-10-06 ENCOUNTER — Encounter: Payer: Self-pay | Admitting: Sports Medicine

## 2013-10-06 VITALS — BP 132/73 | HR 81 | Ht 64.0 in | Wt 154.0 lb

## 2013-10-06 DIAGNOSIS — M4310 Spondylolisthesis, site unspecified: Secondary | ICD-10-CM

## 2013-10-06 DIAGNOSIS — Z0000 Encounter for general adult medical examination without abnormal findings: Secondary | ICD-10-CM

## 2013-10-06 DIAGNOSIS — Z299 Encounter for prophylactic measures, unspecified: Secondary | ICD-10-CM

## 2013-10-06 NOTE — Assessment & Plan Note (Signed)
Physical today. Healthy, cervical cancer screening was done last year.

## 2013-10-06 NOTE — Progress Notes (Signed)
  Subjective:    CC: CPE.  HPI:  Preventive measures: Rebecca Roach is here for complete physical, she is up-to-date on all of her screening, Pap smear was done last year around November.  Leg pain and back pain: Did have mild retrolisthesis of L5 on S1, she has been doing rehabilitation exercises and symptoms have improved.  Past medical history, Surgical history, Family history not pertinant except as noted below, Social history, Allergies, and medications have been entered into the medical record, reviewed, and no changes needed.   Review of Systems: No headache, visual changes, nausea, vomiting, diarrhea, constipation, dizziness, abdominal pain, skin rash, fevers, chills, night sweats, swollen lymph nodes, weight loss, chest pain, body aches, joint swelling, muscle aches, shortness of breath, mood changes, visual or auditory hallucinations.  Objective:    General: Well Developed, well nourished, and in no acute distress.  Neuro: Alert and oriented x3, extra-ocular muscles intact, sensation grossly intact.  HEENT: Normocephalic, atraumatic, pupils equal round reactive to light, neck supple, no masses, no lymphadenopathy, thyroid nonpalpable. Oropharynx, nasopharynx, external ear canals are unremarkable. Skin: Warm and dry, no rashes noted.  Cardiac: Regular rate and rhythm, no murmurs rubs or gallops.  Respiratory: Clear to auscultation bilaterally. Not using accessory muscles, speaking in full sentences.  Abdominal: Soft, nontender, nondistended, positive bowel sounds, no masses, no organomegaly.  Musculoskeletal: Shoulder, elbow, wrist, hip, knee, ankle stable, and with full range of motion.  Impression and Recommendations:    The patient was counselled, risk factors were discussed, anticipatory guidance given.

## 2013-10-06 NOTE — Assessment & Plan Note (Signed)
Improving significantly with conservative measures and home rehabilitation.

## 2014-01-27 ENCOUNTER — Ambulatory Visit (INDEPENDENT_AMBULATORY_CARE_PROVIDER_SITE_OTHER): Payer: Managed Care, Other (non HMO) | Admitting: Physician Assistant

## 2014-01-27 VITALS — BP 120/76 | HR 90 | Temp 98.2°F | Resp 16 | Ht 64.0 in | Wt 148.0 lb

## 2014-01-27 DIAGNOSIS — Z111 Encounter for screening for respiratory tuberculosis: Secondary | ICD-10-CM

## 2014-01-27 NOTE — Progress Notes (Signed)
PPD placed on left forearm at 4:00 pm.  P:  PPD placed on 01/27/2014.  Patient advised to return for reading within 48-72 hours.

## 2014-01-27 NOTE — Progress Notes (Signed)
  Tuberculosis Risk Questionnaire  1. No Were you born outside the [place] in one of the following parts of the world: [place], [place], [place], [place] or [place]?    2. No Have you traveled outside the [place] and lived for more than one month in one of the following parts of the world: [place], [place], [place], [place] or [place]?    3. No Do you have a compromised immune system such as from any of the following conditions:HIV/AIDS, organ or bone marrow transplantation, diabetes, immunosuppressive medicines (e.g. Prednisone, Remicaide), leukemia, lymphoma, cancer of the head or neck, gastrectomy or jejunal bypass, end-stage renal disease (on dialysis), or silicosis?     4. Yes [facility]) Have you ever or do you plan on working in: a residential care center, a health care facility, a jail or prison or homeless shelter?    5. No Have you ever: injected illegal drugs, used crack cocaine, lived in a homeless shelter  or been in jail or prison?     6. No Have you ever been exposed to anyone with infectious tuberculosis?    Tuberculosis Symptom Questionnaire  Do you currently have any of the following symptoms?  1. No Unexplained cough lasting more than 3 weeks?   2. No Unexplained fever lasting more than 3 weeks.   3. No Night Sweats (sweating that leaves the bedclothes and sheets wet)     4. No Shortness of Breath   5. No Chest Pain   6. No Unintentional weight loss    7. No Unexplained fatigue (very tired for no reason)

## 2014-01-29 ENCOUNTER — Ambulatory Visit (INDEPENDENT_AMBULATORY_CARE_PROVIDER_SITE_OTHER): Payer: Managed Care, Other (non HMO)

## 2014-01-29 DIAGNOSIS — Z111 Encounter for screening for respiratory tuberculosis: Secondary | ICD-10-CM

## 2014-01-29 LAB — TB SKIN TEST
INDURATION: 0 mm
TB SKIN TEST: NEGATIVE

## 2014-07-16 ENCOUNTER — Encounter: Payer: Self-pay | Admitting: Sports Medicine

## 2014-07-16 ENCOUNTER — Ambulatory Visit (INDEPENDENT_AMBULATORY_CARE_PROVIDER_SITE_OTHER): Payer: Managed Care, Other (non HMO) | Admitting: Sports Medicine

## 2014-07-16 VITALS — BP 132/83 | HR 105 | Ht 64.0 in | Wt 145.0 lb

## 2014-07-16 DIAGNOSIS — F419 Anxiety disorder, unspecified: Secondary | ICD-10-CM

## 2014-07-16 NOTE — Progress Notes (Signed)
  Subjective:    CC: Anxiety  HPI: Rebecca Roach comes in, she is very anxious and doesn't know what to do, she is no longer attracted to her husband and desires additional sexual encounters elsewhere. She has discussed this with him, but has met with essentially a brick wall. He seems to be very controlling, she does not want to leave him, and likes the stability provides however is just not attracted.  Past medical history, Surgical history, Family history not pertinant except as noted below, Social history, Allergies, and medications have been entered into the medical record, reviewed, and no changes needed.   Review of Systems: No fevers, chills, night sweats, weight loss, chest pain, or shortness of breath.   Objective:    General: Well Developed, well nourished, and in no acute distress.  Neuro: Alert and oriented x3, extra-ocular muscles intact, sensation grossly intact.  HEENT: Normocephalic, atraumatic, pupils equal round reactive to light, neck supple, no masses, no lymphadenopathy, thyroid nonpalpable.  Skin: Warm and dry, no rashes. Cardiac: Regular rate and rhythm, no murmurs rubs or gallops, no lower extremity edema.  Respiratory: Clear to auscultation bilaterally. Not using accessory muscles, speaking in full sentences.  Impression and Recommendations:    I spent 40 minutes with this patient, greater than 50% was face-to-face time counseling regarding the above diagnosis.

## 2014-07-16 NOTE — Assessment & Plan Note (Addendum)
Centers around marital stress, I have asked her to discuss this in further detail with her husband. Follow-up in 2 months.

## 2014-08-23 ENCOUNTER — Ambulatory Visit (INDEPENDENT_AMBULATORY_CARE_PROVIDER_SITE_OTHER): Payer: Managed Care, Other (non HMO) | Admitting: Family Medicine

## 2014-08-23 VITALS — BP 122/78 | HR 78 | Temp 97.4°F | Resp 17 | Ht 64.0 in | Wt 141.0 lb

## 2014-08-23 DIAGNOSIS — J988 Other specified respiratory disorders: Secondary | ICD-10-CM

## 2014-08-23 DIAGNOSIS — J22 Unspecified acute lower respiratory infection: Secondary | ICD-10-CM

## 2014-08-23 DIAGNOSIS — R05 Cough: Secondary | ICD-10-CM

## 2014-08-23 DIAGNOSIS — R062 Wheezing: Secondary | ICD-10-CM | POA: Diagnosis not present

## 2014-08-23 DIAGNOSIS — R059 Cough, unspecified: Secondary | ICD-10-CM

## 2014-08-23 MED ORDER — ALBUTEROL SULFATE HFA 108 (90 BASE) MCG/ACT IN AERS: 1.0000 | INHALATION_SPRAY | RESPIRATORY_TRACT | Status: DC | PRN

## 2014-08-23 MED ORDER — AZITHROMYCIN 250 MG PO TABS: ORAL_TABLET | ORAL | Status: DC

## 2014-08-23 NOTE — Patient Instructions (Signed)
Saline nasal spray atleast 4 times per day, over the counter mucinex or mucinex DM, drink plenty of fluids.  Albuterol if needed for wheezing. If you need this medicine more than 2-3 times per day or persistent use for next 3 days - return for recheck.  If cough not improving into next week - can fill antibiotic, but your symptoms appear to be due to a virus at this time.  Return to the clinic or go to the nearest emergency room if any of your symptoms worsen or new symptoms occur.  Cough, Adult  A cough is a reflex that helps clear your throat and airways. It can help heal the body or may be a reaction to an irritated airway. A cough may only last 2 or 3 weeks (acute) or may last more than 8 weeks (chronic).  CAUSES Acute cough:  Viral or bacterial infections. Chronic cough:  Infections.  Allergies.  Asthma.  Post-nasal drip.  Smoking.  Heartburn or acid reflux.  Some medicines.  Chronic lung problems (COPD).  Cancer. SYMPTOMS   Cough.  Fever.  Chest pain.  Increased breathing rate.  High-pitched whistling sound when breathing (wheezing).  Colored mucus that you cough up (sputum). TREATMENT   A bacterial cough may be treated with antibiotic medicine.  A viral cough must run its course and will not respond to antibiotics.  Your caregiver may recommend other treatments if you have a chronic cough. HOME CARE INSTRUCTIONS   Only take over-the-counter or prescription medicines for pain, discomfort, or fever as directed by your caregiver. Use cough suppressants only as directed by your caregiver.  Use a cold steam vaporizer or humidifier in your bedroom or home to help loosen secretions.  Sleep in a semi-upright position if your cough is worse at night.  Rest as needed.  Stop smoking if you smoke. SEEK IMMEDIATE MEDICAL CARE IF:   You have pus in your sputum.  Your cough starts to worsen.  You cannot control your cough with suppressants and are losing  sleep.  You begin coughing up blood.  You have difficulty breathing.  You develop pain which is getting worse or is uncontrolled with medicine.  You have a fever. MAKE SURE YOU:   Understand these instructions.  Will watch your condition.  Will get help right away if you are not doing well or get worse. Document Released: 11/21/2010 Document Revised: 08/17/2011 Document Reviewed: 11/21/2010 [facility] Patient Information 2015 [place], [place]. This information is not intended to replace advice given to you by your health care provider. Make sure you discuss any questions you have with your health care provider.  Bronchospasm A bronchospasm is when the tubes that carry air in and out of your lungs (airways) spasm or tighten. During a bronchospasm it is hard to breathe. This is because the airways get smaller. A bronchospasm can be triggered by:  Allergies. These may be to animals, pollen, food, or mold.  Infection. This is a common cause of bronchospasm.  Exercise.  Irritants. These include pollution, cigarette smoke, strong odors, aerosol sprays, and paint fumes.  Weather changes.  Stress.  Being emotional. HOME CARE   Always have a plan for getting help. Know when to call your doctor and local emergency services (911 in the [place] Know where you can get emergency care.  Only take medicines as told by your doctor.  If you were prescribed an inhaler or nebulizer machine, ask your doctor how to use it correctly. Always use a spacer with  your inhaler if you were given one.  Stay calm during an attack. Try to relax and breathe more slowly.  Control your home environment:  Change your heating and air conditioning filter at least once a month.  Limit your use of fireplaces and wood stoves.  Do not  smoke. Do not  allow smoking in your home.  Avoid perfumes and fragrances.  Get rid of pests (such as roaches and mice) and their droppings.  Throw away plants if you see  mold on them.  Keep your house clean and dust free.  Replace carpet with wood, tile, or vinyl flooring. Carpet can trap dander and dust.  Use allergy-proof pillows, mattress covers, and box spring covers.  Wash bed sheets and blankets every week in hot water. Dry them in a dryer.  Use blankets that are made of polyester or cotton.  Wash hands frequently. GET HELP IF:  You have muscle aches.  You have chest pain.  The thick spit you spit or cough up (sputum) changes from clear or white to yellow, green, gray, or bloody.  The thick spit you spit or cough up gets thicker.  There are problems that may be related to the medicine you are given such as:  A rash.  Itching.  Swelling.  Trouble breathing. GET HELP RIGHT AWAY IF:  You feel you cannot breathe or catch your breath.  You cannot stop coughing.  Your treatment is not helping you breathe better.  You have very bad chest pain. MAKE SURE YOU:   Understand these instructions.  Will watch your condition.  Will get help right away if you are not doing well or get worse. Document Released: 03/22/2009 Document Revised: 05/30/2013 Document Reviewed: 11/15/2012 [facility] Patient Information 2015 [place]. This information is not intended to replace advice given to you by your health care provider. Make sure you discuss any questions you have with your health care provider.

## 2014-08-23 NOTE — Progress Notes (Signed)
Subjective:    Patient ID: Rebecca Roach, female    DOB: 1985/12/10, 29 y.o.   MRN: [id number] This chart was scribed for Rebecca Staggers, MD by Littie Deeds, Medical Scribe. This patient was seen in Room 14 and the patient's care was started at 2:27 PM.   HPI HPI Comments: Rebecca Roach is a 29 y.o. female who presents to the [facility] complaining of gradual onset URI symptoms that started 6 days ago. Patient reports having dry cough, rhinorrhea, fatigue, generalized myalgias, loss of appetite, subjective fever and chills. Her symptoms have improved since onset, but she feels as if her symptoms moved to her chest. She started having wheezing with coughing episodes 4 days ago and is still having fatigue. Her coughing episodes have not been often lately. She has taken Theraflu, vitamin C, Gatorade (for hydration), and generic cough OTC medication with some relief. Patient denies any recent fever. She does note having seasonal allergies, but she denies hx of asthma.   Patient Active Problem List   Diagnosis Date Noted  . Vacuum extractor delivery, delivered 03/29/2013  . Elevated blood pressure (not hypertension) 11/08/2012  . Tick bite 11/01/2012  . Pregnant 08/31/2012  . 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 Preventive measure 04/20/2012  . Anxiety 04/20/2012   Past Medical History  Diagnosis Date  . Anxiety   . Cyst of left ovary   . Hypertension     PIH  . Allergy    Past Surgical History  Procedure Laterality Date  . Mouth surgery      gum   Allergies  Allergen Reactions  . Oxycodone Nausea And Vomiting  . Latex Rash   Prior to Admission medications   Medication Sig Start Date End Date Taking? Authorizing Provider  Multiple Vitamin (MULTIVITAMIN WITH MINERALS) TABS Take 1 tablet by mouth at bedtime.   Yes Historical Provider, MD  tazarotene (TAZORAC) 0.05 % cream Apply topically at bedtime.   Yes Historical Provider, MD  Biotin 1000 MCG  tablet Take 1,000 mcg by mouth daily.     Historical Provider, MD   History   Social History  . Marital Status: Married    Spouse Name: N/A  . Number of Children: N/A  . Years of Education: N/A   Occupational History  . Not on file.   Social History Main Topics  . Smoking status: Never Smoker   . Smokeless tobacco: Never Used  . Alcohol Use: No  . Drug Use: No  . Sexual Activity: Yes    Birth Control/ Protection: None   Other Topics Concern  . Not on file   Social History Narrative     Review of Systems  Constitutional: Positive for fever, chills, appetite change and fatigue.  HENT: Positive for rhinorrhea.   Respiratory: Positive for cough and wheezing.   Musculoskeletal: Positive for myalgias.       Objective:   Physical Exam  Constitutional: She is oriented to person, place, and time. She appears well-developed and well-nourished. No distress.  HENT:  Head: Normocephalic and atraumatic.  Right Ear: Hearing, tympanic membrane, external ear and ear canal normal.  Left Ear: Hearing, tympanic membrane, external ear and ear canal normal.  Nose: Nose normal.  Mouth/Throat: Oropharynx is clear and moist. No oropharyngeal exudate.  Eyes: Conjunctivae and EOM are normal. Pupils are equal, round, and reactive to light.  Cardiovascular: Normal rate, regular rhythm, normal heart sounds and intact distal pulses.  Exam reveals no gallop and  no friction rub.   No murmur heard. Pulmonary/Chest: Effort normal and breath sounds normal. No respiratory distress. She has no wheezes. She has no rhonchi.  Neurological: She is alert and oriented to person, place, and time.  Skin: Skin is warm and dry. No rash noted.  Psychiatric: She has a normal mood and affect. Her behavior is normal.  Vitals reviewed.     Filed Vitals:   08/23/14 1344  BP: 122/78  Pulse: 78  Temp: 97.4 F (36.3 C)  TempSrc: Oral  Resp: 17  Height: 5\' 4"  (1.626 m)  Weight: 141 lb (63.957 kg)  SpO2: 98%         Assessment & Plan:   Rebecca Roach is a 29 y.o. female Cough  Wheezing - Plan: albuterol (PROVENTIL HFA;VENTOLIN HFA) 108 (90 BASE) MCG/ACT inhaler  LRTI (lower respiratory tract infection) - Plan: azithromycin (ZITHROMAX) 250 MG tablet  Suspected viral illness, with clear exam. No wheeze at this time, but albuterol provided if this occurs - RTC precautions.  Possible early bronchitis/LRTI - can fill Zpak next week if not starting to improve. rtc sooner if worse.   Meds ordered this encounter  Medications  . albuterol (PROVENTIL HFA;VENTOLIN HFA) 108 (90 BASE) MCG/ACT inhaler    Sig: Inhale 1-2 puffs into the lungs every 4 (four) hours as needed for wheezing or shortness of breath.    Dispense:  1 Inhaler    Refill:  0  . azithromycin (ZITHROMAX) 250 MG tablet    Sig: Take 2 pills by mouth on day 1, then 1 pill by mouth per day on days 2 through 5.    Dispense:  6 tablet    Refill:  0   Patient Instructions  Saline nasal spray atleast 4 times per day, over the counter mucinex or mucinex DM, drink plenty of fluids.  Albuterol if needed for wheezing. If you need this medicine more than 2-3 times per day or persistent use for next 3 days - return for recheck.  If cough not improving into next week - can fill antibiotic, but your symptoms appear to be due to a virus at this time.  Return to the clinic or go to the nearest emergency room if any of your symptoms worsen or new symptoms occur.  Cough, Adult  A cough is a reflex that helps clear your throat and airways. It can help heal the body or may be a reaction to an irritated airway. A cough may only last 2 or 3 weeks (acute) or may last more than 8 weeks (chronic).  CAUSES Acute cough:  Viral or bacterial infections. Chronic cough:  Infections.  Allergies.  Asthma.  Post-nasal drip.  Smoking.  Heartburn or acid reflux.  Some medicines.  Chronic lung problems (COPD).  Cancer. SYMPTOMS    Cough.  Fever.  Chest pain.  Increased breathing rate.  High-pitched whistling sound when breathing (wheezing).  Colored mucus that you cough up (sputum). TREATMENT   A bacterial cough may be treated with antibiotic medicine.  A viral cough must run its course and will not respond to antibiotics.  Your caregiver may recommend other treatments if you have a chronic cough. HOME CARE INSTRUCTIONS   Only take over-the-counter or prescription medicines for pain, discomfort, or fever as directed by your caregiver. Use cough suppressants only as directed by your caregiver.  Use a cold steam vaporizer or humidifier in your bedroom or home to help loosen secretions.  Sleep in a semi-upright position if  your cough is worse at night.  Rest as needed.  Stop smoking if you smoke. SEEK IMMEDIATE MEDICAL CARE IF:   You have pus in your sputum.  Your cough starts to worsen.  You cannot control your cough with suppressants and are losing sleep.  You begin coughing up blood.  You have difficulty breathing.  You develop pain which is getting worse or is uncontrolled with medicine.  You have a fever. MAKE SURE YOU:   Understand these instructions.  Will watch your condition.  Will get help right away if you are not doing well or get worse. Document Released: 11/21/2010 Document Revised: 08/17/2011 Document Reviewed: 11/21/2010 [facility] Patient Information 2015 [place], [place]. This information is not intended to replace advice given to you by your health care provider. Make sure you discuss any questions you have with your health care provider.  Bronchospasm A bronchospasm is when the tubes that carry air in and out of your lungs (airways) spasm or tighten. During a bronchospasm it is hard to breathe. This is because the airways get smaller. A bronchospasm can be triggered by:  Allergies. These may be to animals, pollen, food, or mold.  Infection. This is a common cause of  bronchospasm.  Exercise.  Irritants. These include pollution, cigarette smoke, strong odors, aerosol sprays, and paint fumes.  Weather changes.  Stress.  Being emotional. HOME CARE   Always have a plan for getting help. Know when to call your doctor and local emergency services (911 in the [place] Know where you can get emergency care.  Only take medicines as told by your doctor.  If you were prescribed an inhaler or nebulizer machine, ask your doctor how to use it correctly. Always use a spacer with your inhaler if you were given one.  Stay calm during an attack. Try to relax and breathe more slowly.  Control your home environment:  Change your heating and air conditioning filter at least once a month.  Limit your use of fireplaces and wood stoves.  Do not  smoke. Do not  allow smoking in your home.  Avoid perfumes and fragrances.  Get rid of pests (such as roaches and mice) and their droppings.  Throw away plants if you see mold on them.  Keep your house clean and dust free.  Replace carpet with wood, tile, or vinyl flooring. Carpet can trap dander and dust.  Use allergy-proof pillows, mattress covers, and box spring covers.  Wash bed sheets and blankets every week in hot water. Dry them in a dryer.  Use blankets that are made of polyester or cotton.  Wash hands frequently. GET HELP IF:  You have muscle aches.  You have chest pain.  The thick spit you spit or cough up (sputum) changes from clear or white to yellow, green, gray, or bloody.  The thick spit you spit or cough up gets thicker.  There are problems that may be related to the medicine you are given such as:  A rash.  Itching.  Swelling.  Trouble breathing. GET HELP RIGHT AWAY IF:  You feel you cannot breathe or catch your breath.  You cannot stop coughing.  Your treatment is not helping you breathe better.  You have very bad chest pain. MAKE SURE YOU:   Understand these  instructions.  Will watch your condition.  Will get help right away if you are not doing well or get worse. Document Released: 03/22/2009 Document Revised: 05/30/2013 Document Reviewed: 11/15/2012 [facility] Patient Information 2015 [place]. This information is not intended to replace advice given to you by your health care provider. Make sure you discuss any questions you have with your health care provider.     I personally performed the services described in this documentation, which was scribed in my presence. The recorded information has been reviewed and considered, and addended by me as needed.

## 2014-09-14 ENCOUNTER — Ambulatory Visit: Payer: Managed Care, Other (non HMO) | Admitting: Sports Medicine

## 2014-10-05 ENCOUNTER — Ambulatory Visit: Payer: Managed Care, Other (non HMO) | Admitting: Sports Medicine

## 2014-10-09 ENCOUNTER — Ambulatory Visit: Payer: Managed Care, Other (non HMO) | Admitting: Sports Medicine

## 2014-10-09 ENCOUNTER — Encounter: Payer: Self-pay | Admitting: Sports Medicine

## 2014-10-09 ENCOUNTER — Telehealth: Payer: Self-pay | Admitting: Sports Medicine

## 2014-10-09 ENCOUNTER — Ambulatory Visit (INDEPENDENT_AMBULATORY_CARE_PROVIDER_SITE_OTHER): Payer: Managed Care, Other (non HMO) | Admitting: Sports Medicine

## 2014-10-09 DIAGNOSIS — F419 Anxiety disorder, unspecified: Secondary | ICD-10-CM

## 2014-10-09 NOTE — Telephone Encounter (Signed)
Patient was advised that her information is kept confidential and only released to persons she release us to. Note was forwarded to PCP. Rhonda Cunningham,CMA
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
## 2014-10-12 NOTE — Addendum Note (Signed)
Addended by: Monica BectonHEKKEKANDAM, THOMAS J on: 10/12/2014 09:46 AM   Modules accepted: Orders

## 2014-10-12 NOTE — Telephone Encounter (Signed)
Discussed with patient and with behavioral health downstairs, our therapist is no longer taking new patients. [facility]

## 2014-11-20 ENCOUNTER — Encounter: Payer: Self-pay | Admitting: Sports Medicine

## 2014-11-20 ENCOUNTER — Ambulatory Visit (INDEPENDENT_AMBULATORY_CARE_PROVIDER_SITE_OTHER): Payer: Managed Care, Other (non HMO) | Admitting: Sports Medicine

## 2014-11-20 VITALS — BP 145/76 | HR 112 | Ht 64.0 in | Wt 142.0 lb

## 2014-11-20 DIAGNOSIS — F419 Anxiety disorder, unspecified: Secondary | ICD-10-CM

## 2014-11-20 NOTE — Assessment & Plan Note (Signed)
Everything seems to be well controlled. Return as needed.

## 2014-11-20 NOTE — Progress Notes (Signed)
  Subjective:    CC: Follow-up  HPI: Stress and anxiety: Improved significantly, she did see a behavioral therapist once. She wanted to discuss other issues with her job, I was happy to listen.  Past medical history, Surgical history, Family history not pertinant except as noted below, Social history, Allergies, and medications have been entered into the medical record, reviewed, and no changes needed.   Review of Systems: No fevers, chills, night sweats, weight loss, chest pain, or shortness of breath.   Objective:    General: Well Developed, well nourished, and in no acute distress.  Neuro: Alert and oriented x3, extra-ocular muscles intact, sensation grossly intact.  HEENT: Normocephalic, atraumatic, pupils equal round reactive to light, neck supple, no masses, no lymphadenopathy, thyroid nonpalpable.  Skin: Warm and dry, no rashes. Cardiac: Regular rate and rhythm, no murmurs rubs or gallops, no lower extremity edema.  Respiratory: Clear to auscultation bilaterally. Not using accessory muscles, speaking in full sentences.  Impression and Recommendations:    I spent 40 minutes with this patient, 50% was face-to-face time counseling regarding the above diagnosis.

## 2014-11-30 ENCOUNTER — Encounter: Payer: Self-pay | Admitting: Sports Medicine

## 2014-11-30 ENCOUNTER — Telehealth: Payer: Self-pay

## 2014-11-30 NOTE — Telephone Encounter (Signed)
Patient called requested that PCP give her a call regarding some adjustments in her medical record, asked patient about specifics and she would not go into details but she stated that she wanted a call back form PCP. Rhonda Cunningham,CMA

## 2014-12-01 NOTE — Telephone Encounter (Signed)
Please ask her to send a Designer, industrial/product with the specific requests to me.

## 2014-12-03 NOTE — Telephone Encounter (Signed)
Pt advised to send mychart message and verbalized understanding.

## 2015-02-28 ENCOUNTER — Other Ambulatory Visit: Payer: Self-pay

## 2015-06-05 ENCOUNTER — Other Ambulatory Visit: Payer: Self-pay | Admitting: Internal Medicine

## 2015-09-30 ENCOUNTER — Ambulatory Visit (INDEPENDENT_AMBULATORY_CARE_PROVIDER_SITE_OTHER): Payer: Managed Care, Other (non HMO) | Admitting: Pediatrics

## 2015-09-30 ENCOUNTER — Encounter: Payer: Self-pay | Admitting: Pediatrics

## 2015-09-30 VITALS — BP 116/70 | HR 80 | Temp 97.7°F | Resp 16 | Ht 64.17 in | Wt 135.6 lb

## 2015-09-30 DIAGNOSIS — J301 Allergic rhinitis due to pollen: Secondary | ICD-10-CM | POA: Diagnosis not present

## 2015-09-30 DIAGNOSIS — J4520 Mild intermittent asthma, uncomplicated: Secondary | ICD-10-CM | POA: Diagnosis not present

## 2015-09-30 HISTORY — DX: Mild intermittent asthma, uncomplicated: J45.20

## 2015-09-30 MED ORDER — MONTELUKAST SODIUM 10 MG PO TABS: ORAL_TABLET | ORAL | Status: AC

## 2015-09-30 MED ORDER — ALBUTEROL SULFATE 108 (90 BASE) MCG/ACT IN AEPB: 2.0000 | INHALATION_SPRAY | Freq: Four times a day (QID) | RESPIRATORY_TRACT | Status: AC | PRN

## 2015-09-30 NOTE — Patient Instructions (Addendum)
Environmental control of dust mite and mold Keep the cat out of her bedroom Fexofenadine 180 mg once a day for runny nose or itchy eyes Nasacort 2 sprays per nostril once a day for stuffy nose Opcon-A one drop 3 times a day if needed for itchy eyes Zaditor 0.025% eyedrops-one drop 3 times a day to prevent allergies Montelukast  10 mg once a day for cough or wheeze. It may also help your allergic symptoms Pro-air RespiClick--2 puffs every 4 hours if needed for wheezing or coughing spells. You may use 2 puffs 5-15 minutes before exercise Prednisone 10 mg twice a day for 4 days, 10 mg on the fifth day to bring your allergic symptoms under control She is a very allergic individual who would benefit from allergy injections

## 2015-09-30 NOTE — Progress Notes (Signed)
[address] [place] [address] Dept: [phone]  New Patient Note  Patient ID: Rebecca Roach, female    DOB: Jan 17, 1986  Age: 30 y.o. MRN: [id number] Date of Office Visit: 09/30/2015 Referring provider: Roger KillBreejante J Williams, PA-C [address] [place], [place] [id number]    Chief Complaint: Rhinitis and Eyes Itch and burn  HPI Rebecca Roach presents for evaluation of nasal congestion for several years. Her symptoms are worse in the springtime and in the winter.. She has not improved with the use of Flonase, Nasonex, Allegra, Zyrtec, Claritin. She has aggravation of her symptoms on exposure to dust ,cigarette smoke and cats. She has been noticing some coughing and shortness of breath at night and some coughing and shortness of breath with exercise.. She has never had eczema or food allergies  Review of Systems  Constitutional: Negative.   HENT:       Nasal congestion for several years which is worse in the springtime and the winter months  Eyes:       Itchy eyes in  the springtime  Respiratory:       Coughing and shortness of breath in the springtime.In the springtime she has coughing and shortness of breath with exercise  Cardiovascular: Negative.   Gastrointestinal: Negative.   Genitourinary: Negative.   Musculoskeletal:       She fell in January and hurt her back  Skin:       Congenital hemangioma  Neurological: Negative.   Endo/Heme/Allergies:       No thyroid disease or diabetes. She may be allergic to cats.  Psychiatric/Behavioral: Negative.     Outpatient Encounter Prescriptions as of 09/30/2015  Medication Sig  . fexofenadine (ALLEGRA ALLERGY) 180 MG tablet Take 180 mg by mouth daily.  Marland Kitchen. ketotifen (ALAWAY) 0.025 % ophthalmic solution Place 1 drop into both eyes 2 (two) times daily.  . Norgestimate-Ethinyl Estradiol Triphasic 0.18/0.215/0.25 MG-35 MCG tablet Take 1 tablet by mouth daily.  . Albuterol Sulfate (PROAIR RESPICLICK) 108 (90 Base)  MCG/ACT AEPB Inhale 2 puffs into the lungs every 6 (six) hours as needed.  . fexofenadine (ALLEGRA) 30 MG tablet Take 30 mg by mouth 2 (two) times daily. Reported on 09/30/2015  . montelukast (SINGULAIR) 10 MG tablet One tablet daily for cough or wheeze.  . tazarotene (TAZORAC) 0.05 % cream Apply topically at bedtime. Reported on 09/30/2015   No facility-administered encounter medications on file as of 09/30/2015.     Drug Allergies:  Allergies  Allergen Reactions  . Oxycodone Nausea And Vomiting  . Latex Rash    Family History: Traci's family history includes Allergic rhinitis in her mother; Cancer in her maternal grandfather and paternal aunt; Diabetes in her maternal grandfather, mother, paternal grandfather, and paternal grandmother; Hyperlipidemia in her maternal grandmother and mother. There is no history of Anesthesia problems, Malignant hyperthermia, Hypotension, Pseudochol deficiency, Hearing loss, Angioedema, Asthma, Eczema, Immunodeficiency, or Urticaria..  Social and environmental. There is a cat in the home. She is not exposed to cigarette smoking. She has never smoked  cigarettes. 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  Physical Exam: BP 116/70 mmHg  Pulse 80  Temp(Src) 97.7 F (36.5 C) (Oral)  Resp 16  Ht 5' 4.17" (1.63 m)  Wt 135 lb 9.3 oz (61.5 kg)  BMI 23.15 kg/m2   Physical Exam  Constitutional: She is oriented to person, place, and time. She appears well-developed and well-nourished.  HENT:  Eyes showed mild erythema of the palpebral conjunctiva.  Ears normal. Nose moderate swelling of nasal turbinates with clear nasal discharge. Pharynx normal.  Neck: Neck supple. No thyromegaly present.  Cardiovascular:  S1 and S2 normal no murmurs  Pulmonary/Chest:  Clear to percussion and auscultation  Abdominal: Soft. There is no tenderness (no hepatosplenomegaly).  Lymphadenopathy:    She has no cervical adenopathy.  Neurological: She is alert and oriented  to person, place, and time.  Skin:  Clear  Psychiatric: She has a normal mood and affect. Her behavior is normal. Judgment and thought content normal.  Vitals reviewed.   Diagnostics: Allergy skin tests were extremely positive to grass pollens, weeds, tree pollens, molds, dust mite, horse. She did have some reactivity to cat and dog on intradermal testing only  FVC 3.94 L FEV1 3.14 L. Predicted FVC 3.54 L predicted FEV1 3.03 L. After albuterol 2 puffs FVC 3.81 L FEV1 3.33 L-the spirometry is in the normal range and there was slight improvement in the FEV1 after albuterol ; however peak flow improved 21%   Assessment Assessment and Plan: 1. Mild intermittent asthma, uncomplicated   2. Allergic rhinitis due to pollen     Meds ordered this encounter  Medications  . montelukast (SINGULAIR) 10 MG tablet    Sig: One tablet daily for cough or wheeze.    Dispense:  30 tablet    Refill:  5  . Albuterol Sulfate (PROAIR RESPICLICK) 108 (90 Base) MCG/ACT AEPB    Sig: Inhale 2 puffs into the lungs every 6 (six) hours as needed.    Dispense:  1 each    Refill:  3    PLACE ON HOLD UNTIL PATIENT NEEDS.    Patient Instructions  Environmental control of dust mite and mold Keep the cat out of her bedroom Fexofenadine 180 mg once a day for runny nose or itchy eyes Nasacort 2 sprays per nostril once a day for stuffy nose Opcon-A one drop 3 times a day if needed for itchy eyes Zaditor 0.025% eyedrops-one drop 3 times a day to prevent allergies Montelukast  10 mg once a day for cough or wheeze. It may also help your allergic symptoms Pro-air RespiClick--2 puffs every 4 hours if needed for wheezing or coughing spells. You may use 2 puffs 5-15 minutes before exercise Prednisone 10 mg twice a day for 4 days, 10 mg on the fifth day to bring your allergic symptoms under control She is a very allergic individual who would benefit from allergy injections     Return in about 4 weeks (around  10/28/2015).   Thank you for the opportunity to care for this patient.  Please do not hesitate to contact me with questions.  Tonette Bihari, M.D.  [facility] [address] [place], [place] [address] [phone]

## 2015-10-07 ENCOUNTER — Encounter: Payer: Self-pay | Admitting: Pediatrics

## 2015-10-08 NOTE — Telephone Encounter (Signed)
She should should use her Nasacort nose spray 1 spray per nostril twice a day. Take fexofenadine 180 mg once a day and do not take the 30 mg dose. If she is not better call Augmentin 875 mg-one tablet every 12 hours for suspected sinus infection. Make sure that she can take Augmentin

## 2015-10-16 ENCOUNTER — Telehealth: Payer: Self-pay

## 2015-10-16 NOTE — Telephone Encounter (Signed)
Pt called in today stating she had developed 2 blood filled blisters on her toe and she can not regulate her body temp she also has been having diarrhea all this started when she started montelukast. She wants to know if this could be related to the med? Please advise

## 2015-10-18 NOTE — Telephone Encounter (Signed)
Left message for patient to call office back.

## 2015-10-18 NOTE — Telephone Encounter (Signed)
Montelukast  would not be the cause of blood blisters. Diarrhea would also be very uncommon. I suggest that she stop the montelukast and try again when the diarrhea and the blisters resolve.

## 2015-10-28 ENCOUNTER — Ambulatory Visit: Payer: Managed Care, Other (non HMO) | Admitting: Pediatrics

## 2015-11-25 ENCOUNTER — Telehealth: Payer: Self-pay

## 2015-11-25 NOTE — Telephone Encounter (Signed)
Pt called and stated she thinks she is having a reaction to montelukast. She is always irritated and gained some weight. Per Dr. Nunzio CobbsBobbitt stop monteukast and let us know how her body responds after stopping this medication. Pt informed.  Thanks

## 2016-01-13 ENCOUNTER — Telehealth: Payer: Self-pay | Admitting: Pediatrics

## 2016-01-13 NOTE — Telephone Encounter (Signed)
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 She has been working there for about 2 months.  [phone].

## 2016-01-16 ENCOUNTER — Encounter: Payer: Self-pay | Admitting: Pediatrics

## 2019-03-05 ENCOUNTER — Ambulatory Visit (HOSPITAL_COMMUNITY): Payer: Managed Care, Other (non HMO)

## 2019-03-05 ENCOUNTER — Encounter (HOSPITAL_COMMUNITY): Payer: Self-pay | Admitting: Emergency Medicine

## 2019-03-05 ENCOUNTER — Ambulatory Visit (INDEPENDENT_AMBULATORY_CARE_PROVIDER_SITE_OTHER): Payer: Worker's Compensation

## 2019-03-05 ENCOUNTER — Ambulatory Visit (HOSPITAL_COMMUNITY)
Admission: EM | Admit: 2019-03-05 | Discharge: 2019-03-05 | Disposition: A | Discharge status: Home / Self Care | Payer: Worker's Compensation | Attending: Emergency Medicine | Admitting: Emergency Medicine

## 2019-03-05 ENCOUNTER — Other Ambulatory Visit: Payer: Self-pay

## 2019-03-05 DIAGNOSIS — M545 Low back pain, unspecified: Secondary | ICD-10-CM

## 2019-03-05 DIAGNOSIS — X500XXA Overexertion from strenuous movement or load, initial encounter: Secondary | ICD-10-CM | POA: Diagnosis not present

## 2019-03-05 DIAGNOSIS — Y929 Unspecified place or not applicable: Secondary | ICD-10-CM

## 2019-03-05 DIAGNOSIS — S39012A Strain of muscle, fascia and tendon of lower back, initial encounter: Secondary | ICD-10-CM

## 2019-03-05 MED ORDER — IBUPROFEN 600 MG PO TABS: 600.0000 mg | ORAL_TABLET | Freq: Four times a day (QID) | ORAL | 0 refills | Status: AC | PRN

## 2019-03-05 NOTE — ED Triage Notes (Signed)
Patient has had back pain for 2 days.  Patient says she had an episode of tingling in right arm for a brief episode.  Patient has had lower right back pain "tingling"

## 2019-03-05 NOTE — Discharge Instructions (Signed)
Your x-rays were normal.  You may take 600 mg of ibuprofen combined with 1 g of Tylenol 3-4 times a day as needed for pain.  Heat therapy may be helpful.  Follow-up with occupational health in 5 days if you are not getting better.

## 2019-03-05 NOTE — ED Notes (Signed)
Discharged by provider

## 2019-03-05 NOTE — ED Provider Notes (Signed)
HPI  SUBJECTIVE:  Rebecca Roach is a 33 y.o. female who presents with right low back pain described as tingling starting yesterday after transferring a heavy patient.  States that she was not having any pain prior to transferring the client.  She states that it still hurts but is mild.  No radiation down her leg, leg weakness, saddle anesthesia, urinary or fecal incontinence, urinary retention, distal extremity numbness or tingling.  She has not tried anything for this.  Symptoms are better with sitting upright, worse with transferring, bending forward.  She also reports entire right arm numbness, pain, at the same time that she hurt her back.  This lasted about 10 minutes and has resolved. Denies neck pain.  She is able to move her arm now without any problem.  She states she does not have the proper equipment to transfer her client.  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  No history of back injury, diabetes.  LMP: Last week.  Denies possibility being pregnant.  PMD: at [facility] physicians.    Past Medical History:  Diagnosis Date  . Allergy   . Anxiety   . Cyst of left ovary   . Hypertension    PIH  . Mild intermittent asthma 09/30/2015    Past Surgical History:  Procedure Laterality Date  . MOUTH SURGERY     gum  . TYMPANOSTOMY TUBE PLACEMENT      Family History  Problem Relation Age of Onset  . Hyperlipidemia Mother   . Diabetes Mother   . Allergic rhinitis Mother   . Cancer Paternal Aunt        colon  . Hyperlipidemia Maternal Grandmother   . Cancer Maternal Grandfather        lung, prostate  . Diabetes Maternal Grandfather   . Diabetes Paternal Grandmother   . Diabetes Paternal Grandfather   . Anesthesia problems Neg Hx   . Malignant hyperthermia Neg Hx   . Hypotension Neg Hx   . Pseudochol deficiency Neg Hx   . Hearing loss Neg Hx   . Angioedema Neg Hx   . Asthma Neg Hx   . Eczema Neg Hx    . Immunodeficiency Neg Hx   . Urticaria Neg Hx     Social History   Tobacco Use  . Smoking status: Never Smoker  . Smokeless tobacco: Never Used  Substance Use Topics  . Alcohol use: Yes  . Drug use: No    No current facility-administered medications for this encounter.   Current Outpatient Medications:  .  Brimonidine Tartrate (LUMIFY) 0.025 % SOLN, Apply to eye., Disp: , Rfl:  .  NON FORMULARY, Using a cream for shingles, Disp: , Rfl:  .  Norgestimate-Ethinyl Estradiol Triphasic 0.18/0.215/0.25 MG-35 MCG tablet, Take 1 tablet by mouth daily., Disp: , Rfl:  .  Albuterol Sulfate (PROAIR RESPICLICK) 108 (90 Base) MCG/ACT AEPB, Inhale 2 puffs into the lungs every 6 (six) hours as needed., Disp: 1 each, Rfl: 3 .  fexofenadine (ALLEGRA ALLERGY) 180 MG tablet, Take 180 mg by mouth daily., Disp: , Rfl:  .  ibuprofen (ADVIL) 600 MG tablet, Take 1 tablet (600 mg total) by mouth every 6 (six) hours as needed., Disp: 30 tablet, Rfl: 0 .  ketotifen (ALAWAY) 0.025 % ophthalmic solution, Place 1 drop into both eyes 2 (two) times daily., Disp: , Rfl:  .  montelukast (SINGULAIR) 10 MG tablet, One tablet daily for cough or wheeze.,  Disp: 30 tablet, Rfl: 5  Allergies  Allergen Reactions  . Oxycodone Nausea And Vomiting  . Latex Rash     ROS  As noted in HPI.   Physical Exam  BP 126/69 (BP Location: Right Arm)   Pulse 87   Temp 98.1 F (36.7 C) (Oral)   Resp 18   LMP 02/19/2019   SpO2 100%   Constitutional: Well developed, well nourished, no acute distress Eyes:  EOMI, conjunctiva normal bilaterally HENT: Normocephalic, atraumatic,mucus membranes moist Respiratory: Normal inspiratory effort Cardiovascular: Normal rate GI: nondistended. No suprapubic tenderness skin: No rash, bruising.  Skin intact Musculoskeletal: no CVAT. - paralumbar tenderness,  - muscle spasm. No bony tenderness. Bilateral lower extremities nontender, baseline ROM with intact PT pulses. No pain with int/ext  rotation flex/extension hips bilaterally. SLR neg bilaterally. Sensation baseline light touch bilaterally for Pt, DTR's symmetric and intact bilaterally KJ , Motor symmetric bilateral 5/5 hip flexion, quadriceps, hamstrings, EHL, foot dorsiflexion, foot plantarflexion, gait normal.  Patient has full AROM of the right shoulder.   Neurologic: Alert & oriented x 3, no focal neuro deficits Psychiatric: Speech and behavior appropriate   ED Course   Medications - No data to display  Orders Placed This Encounter  Procedures  . DG Lumbar Spine Complete    Standing Status:   Standing    Number of Occurrences:   1    Order Specific Question:   Reason for Exam (SYMPTOM  OR DIAGNOSIS REQUIRED)    Answer:   R sided LBP    Order Specific Question:   Is patient pregnant?    Answer:   No    No results found for this or any previous visit (from the past 24 hour(s)). Dg Lumbar Spine Complete  Result Date: 03/05/2019 CLINICAL DATA:  Right-sided low back pain EXAM: LUMBAR SPINE - COMPLETE 4+ VIEW COMPARISON:  07/20/2013 FINDINGS: Five non rib-bearing lumbar type vertebra. Lumbar alignment within normal limits. Vertebral body heights are maintained. Disc spaces are within normal limits. IMPRESSION: Negative. Electronically Signed   By: Donavan Foil M.D.   On: 03/05/2019 18:12    ED Clinical Impression  1. Acute right-sided low back pain without sciatica      ED Assessment/Plan  Patient is filing Worker's Compensation.  Checking L-spine given the history of vertebral retrolisthesis.  Reviewed imaging independently.  L-spine normal.  See radiology report for details  No red flags on history or exam.  Presentation consistent with a lumbar strain.  Plan to send home with ibuprofen/Tylenol 3-4 times a day, follow-up with occupational health for further evaluation and for work restrictions if not completely better by the end of the week with rest and conservative measures.  Patient states she works  on the weekends only.  Meds ordered this encounter  Medications  . ibuprofen (ADVIL) 600 MG tablet    Sig: Take 1 tablet (600 mg total) by mouth every 6 (six) hours as needed.    Dispense:  30 tablet    Refill:  0    *This clinic note was created using [employer]. Therefore, there may be occasional mistakes despite careful proofreading.  ?    Melynda Ripple, MD 03/05/19 [id number]

## 2019-09-01 ENCOUNTER — Ambulatory Visit: Payer: Managed Care, Other (non HMO) | Admitting: Physician Assistant

## 2019-11-19 IMAGING — DX DG LUMBAR SPINE COMPLETE 4+V
5 series · 5 of 5 positions shown · non-contrast
Comparison: 07/20/2013

CLINICAL DATA: Right-sided low back pain

EXAM:
LUMBAR SPINE - COMPLETE 4+ VIEW

[l-spine ap]
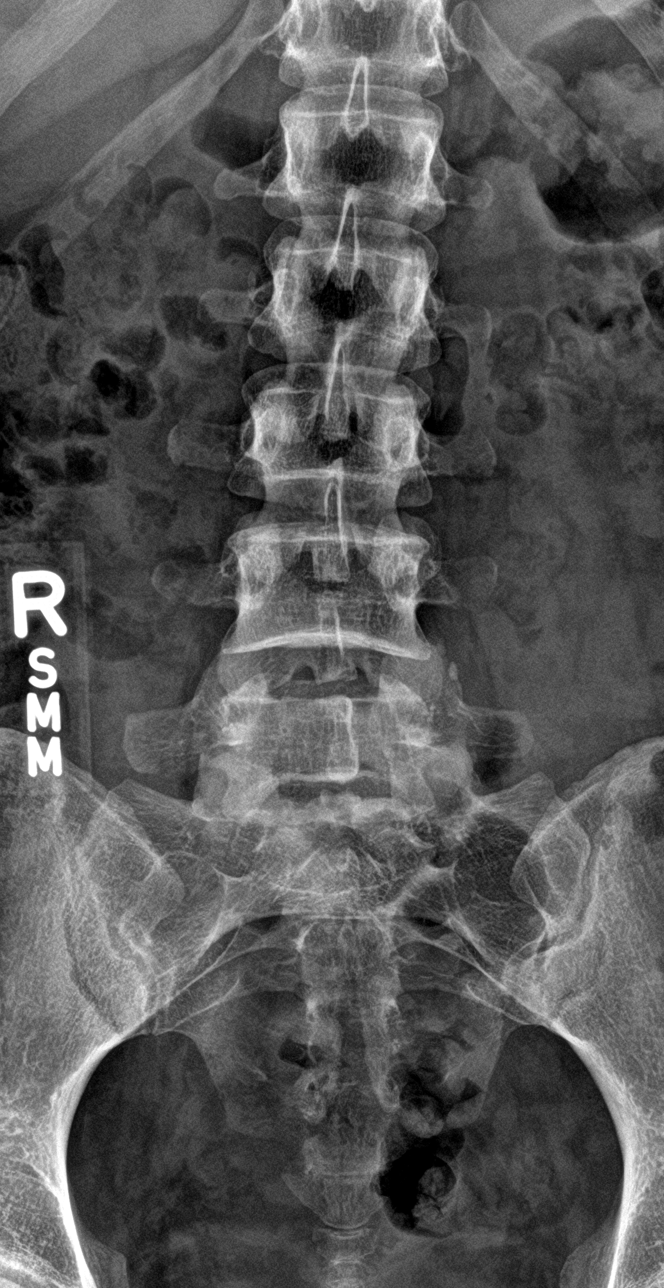

[l-spine obl (1 of 2)]
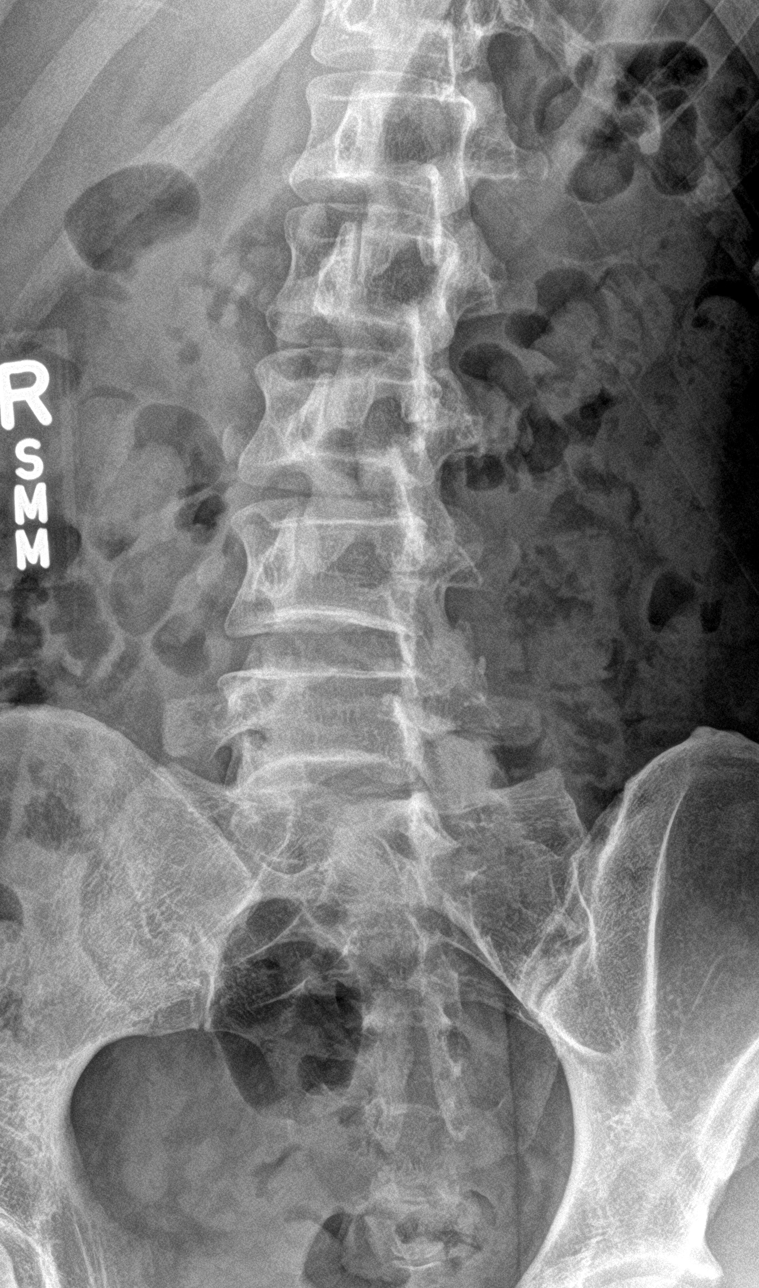

[l-spine obl (2 of 2)]
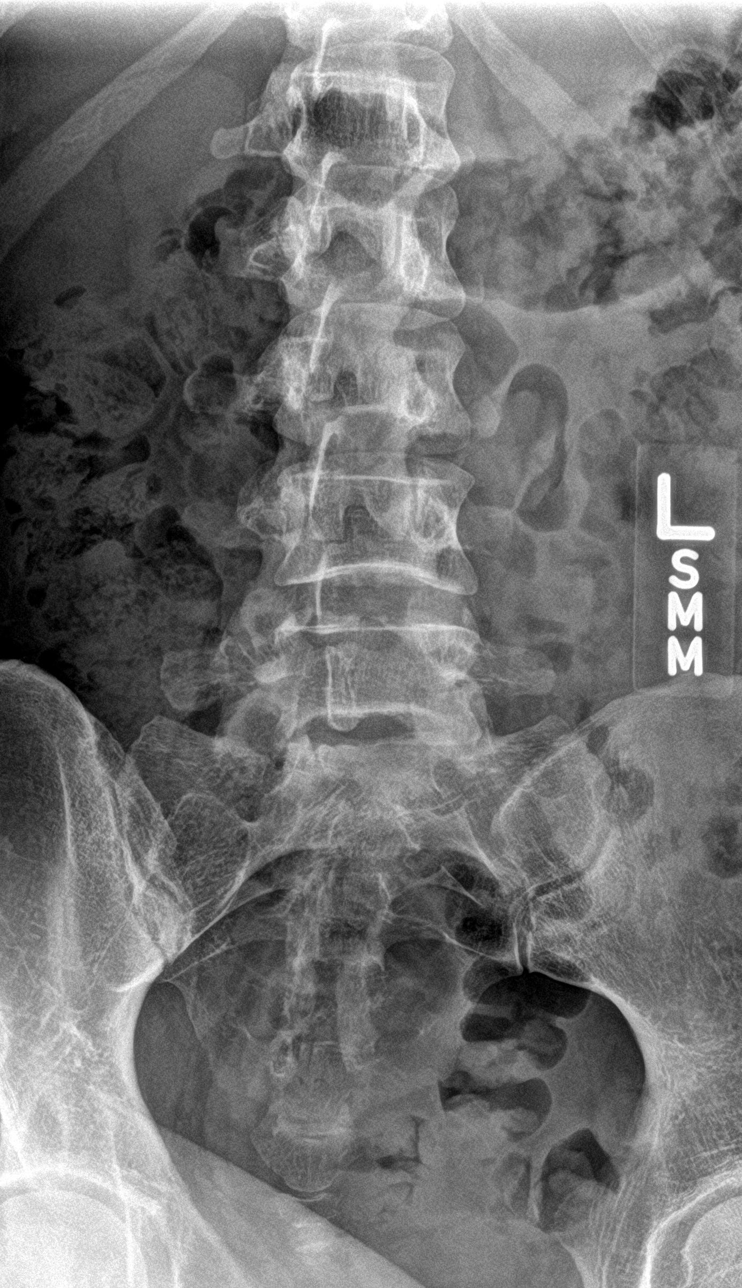

[l-spine lat]
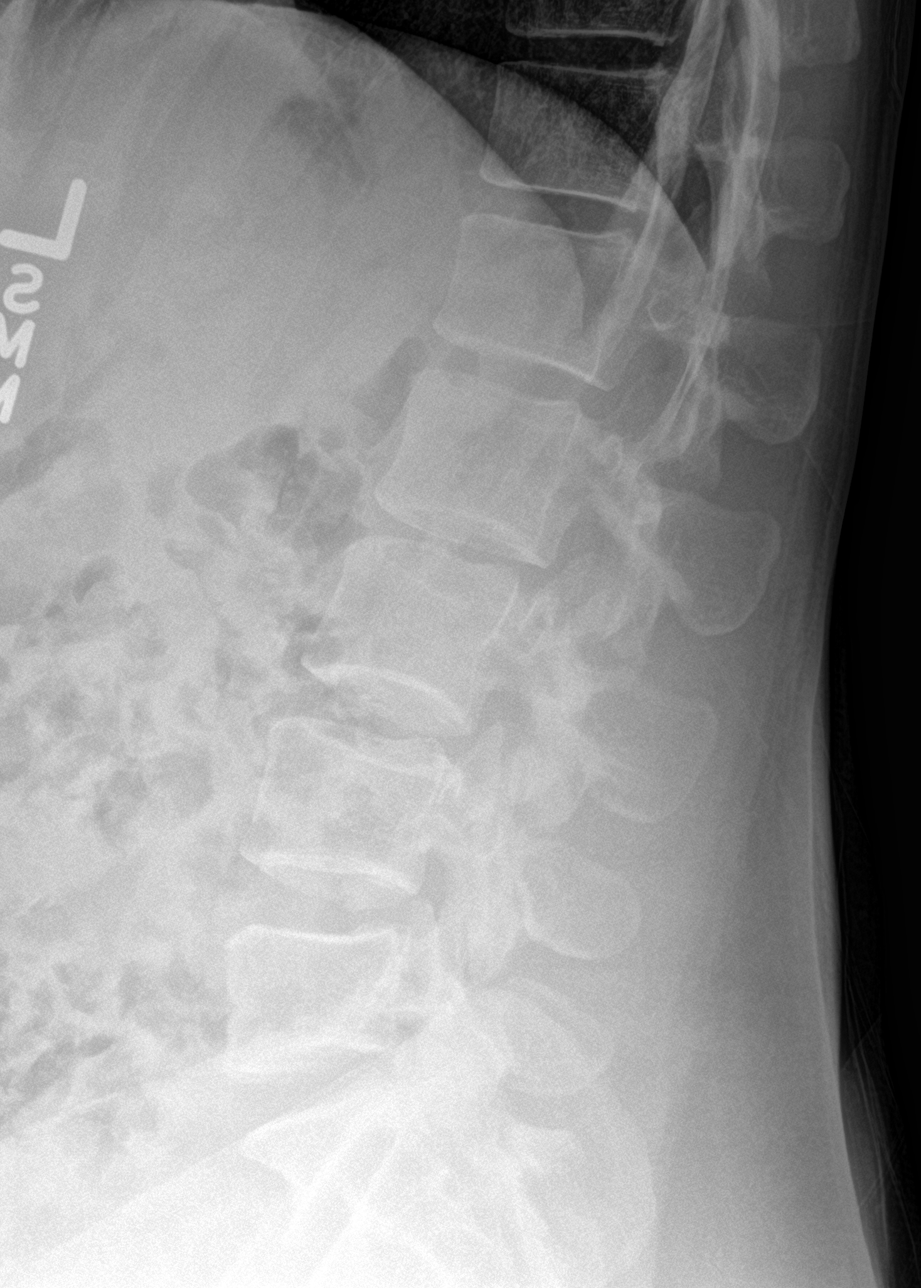

[l-spine spot]
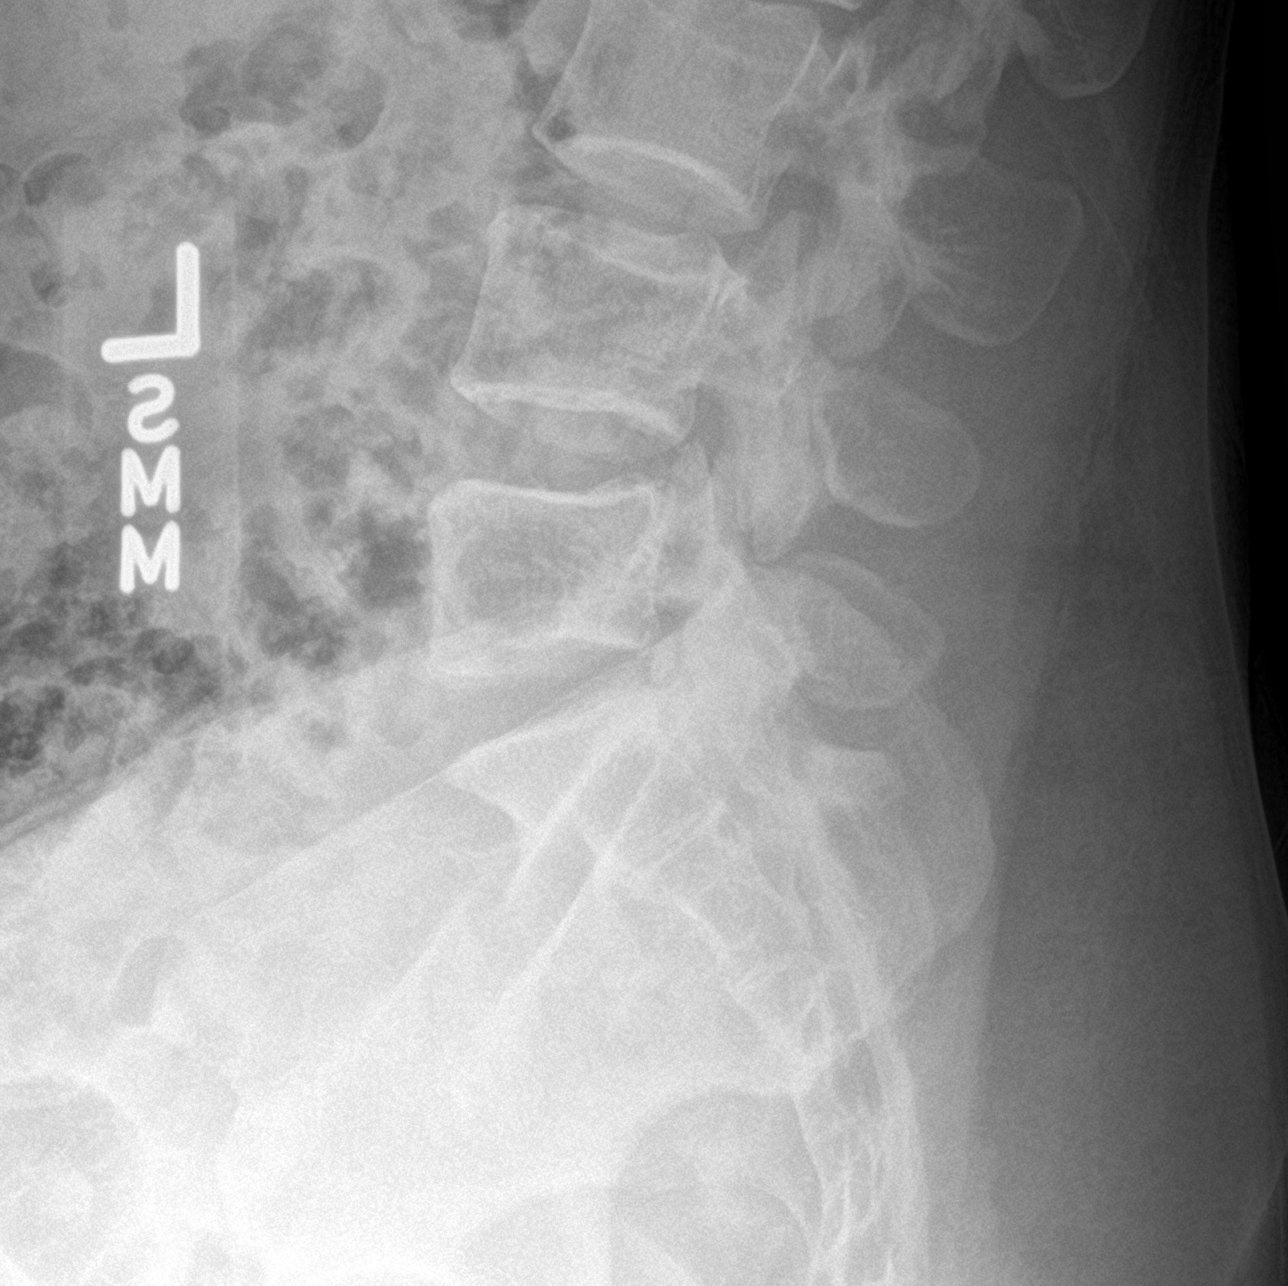

[5 of 5 positions shown; findings below may reference images not displayed]

FINDINGS: Five non rib-bearing lumbar type vertebra. Lumbar alignment within
normal limits. Vertebral body heights are maintained. Disc spaces
are within normal limits.
IMPRESSION: Negative.

## 2020-04-17 ENCOUNTER — Ambulatory Visit: Payer: Managed Care, Other (non HMO)

## 2021-03-01 ENCOUNTER — Encounter (HOSPITAL_COMMUNITY): Payer: Self-pay | Admitting: *Deleted

## 2021-03-01 ENCOUNTER — Ambulatory Visit (HOSPITAL_COMMUNITY)
Admission: EM | Admit: 2021-03-01 | Discharge: 2021-03-01 | Disposition: A | Discharge status: Home / Self Care | Payer: BC Managed Care – PPO | Attending: Physician Assistant | Admitting: Physician Assistant

## 2021-03-01 ENCOUNTER — Other Ambulatory Visit: Payer: Self-pay

## 2021-03-01 DIAGNOSIS — J0141 Acute recurrent pansinusitis: Secondary | ICD-10-CM

## 2021-03-01 DIAGNOSIS — R059 Cough, unspecified: Secondary | ICD-10-CM

## 2021-03-01 MED ORDER — AMOXICILLIN-POT CLAVULANATE 875-125 MG PO TABS: 1.0000 | ORAL_TABLET | Freq: Two times a day (BID) | ORAL | 0 refills | Status: AC

## 2021-03-01 NOTE — ED Provider Notes (Addendum)
[facility]    CSN: [id number] Arrival date & time: 03/01/21  1606      History   Chief Complaint Chief Complaint  Patient presents with   Cough    HPI Rebecca Roach is a 35 y.o. female.   Patient presents today with a several week history of worsening cough.  Reports that for the past several months she has had intermittent sore throat and has been seen by her primary care provider [facility] physicians) several times and has been tested for COVID-19 which has been negative and started on prednisone and Tessalon.  Despite use of these medications she continues to have significant symptoms and reports that mucus is nonproductive and she describes this as thick and colored.  She denies any fever, shortness of breath, nausea, vomiting, chest pain.  She has not seen a specialist regarding intermittent sore throat.  Reports that she has been given antibiotics in the past which provided temporary relief of symptoms; reports last antibiotic use was several months ago.  She does have a history of allergies and asthma and reports using medication as prescribed.  She is confident that she is not pregnant.   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  Authorizing Provider  amoxicillin-clavulanate (AUGMENTIN) 875-125 MG tablet Take 1 tablet by mouth every 12 (twelve) hours for 10 days. 03/01/21 03/11/21 Yes Takaya Hyslop K, PA-C  Albuterol Sulfate (PROAIR RESPICLICK) 108 (90 Base) MCG/ACT AEPB Inhale 2 puffs into the lungs every 6 (six) hours as needed. 09/30/15   Fletcher Anon, MD  Brimonidine Tartrate (LUMIFY) 0.025 % SOLN Apply to eye.    [provider]  fexofenadine (ALLEGRA ALLERGY) 180 MG tablet Take 180 mg by mouth daily.    [provider]  ibuprofen (ADVIL) 600 MG tablet Take 1 tablet (600 mg total) by mouth every 6 (six) hours as needed. 03/05/19   Domenick Gong, MD  ketotifen (ALAWAY) 0.025 % ophthalmic solution Place 1 drop into both eyes 2 (two) times daily.    [provider]  montelukast (SINGULAIR) 10 MG tablet One tablet daily for cough or wheeze. 09/30/15   Fletcher Anon, MD  NON FORMULARY Using a cream for shingles    [provider]  Norgestimate-Ethinyl Estradiol Triphasic 0.18/0.215/0.25 MG-35 MCG tablet Take 1 tablet by mouth daily.    [provider]  Family History Family History  Problem Relation Age of Onset   Hyperlipidemia Mother    Diabetes Mother    Allergic rhinitis Mother    Hyperlipidemia Maternal Grandmother    Cancer Maternal Grandfather        lung, prostate   Diabetes Maternal Grandfather    Diabetes Paternal Grandmother    Diabetes Paternal Grandfather    Cancer Paternal Aunt        colon   Anesthesia problems Neg Hx    Malignant hyperthermia Neg Hx    Hypotension Neg Hx    Pseudochol deficiency Neg Hx    Hearing loss Neg Hx    Angioedema Neg Hx    Asthma Neg Hx    Eczema Neg Hx    Immunodeficiency Neg Hx    Urticaria Neg Hx     Social History Social History   Tobacco Use   Smoking status: Never   Smokeless tobacco: Never  Substance Use Topics   Alcohol use: Yes   Drug use: No     Allergies   Oxycodone and Latex   Review  of Systems Review of Systems  Constitutional:  Negative for activity change, appetite change, fatigue and fever.  HENT:  Positive for congestion, sinus pressure and sore throat. Negative for sneezing.   Respiratory:  Positive for cough. Negative for shortness of breath.   Cardiovascular:  Negative for chest pain.  Gastrointestinal:  Negative for abdominal pain, diarrhea, nausea and vomiting.  Neurological:  Negative for dizziness, light-headedness and headaches.    Physical Exam Triage Vital Signs ED Triage Vitals  Enc Vitals Group     BP 03/01/21 1617 (!) 147/93     Pulse Rate 03/01/21 1617 83     Resp 03/01/21 1617 20     Temp 03/01/21 1617 98.3 F (36.8 C)     Temp src --      SpO2 03/01/21 1617 99 %     Weight --      Height --      Head Circumference --      Peak Flow --      Pain Score 03/01/21 1619 0     Pain Loc --      Pain Edu? --      Excl. in GC? --    No data found.  Updated Vital Signs BP (!) 147/93   Pulse 83   Temp 98.3 F (36.8 C)   Resp 20   LMP 01/29/2021   SpO2 99%   Visual Acuity Right Eye Distance:   Left Eye Distance:   Bilateral Distance:    Right Eye Near:   Left Eye Near:    Bilateral Near:     Physical Exam Vitals reviewed.  Constitutional:      General: She is awake. She is not in acute distress.    Appearance: Normal appearance. She is well-developed. She is not ill-appearing.     Comments: Appears stated age no acute distress  HENT:     Head: Normocephalic and atraumatic.     Right Ear: Tympanic membrane, ear canal and external ear normal. Tympanic membrane is not erythematous or bulging.     Left Ear: Tympanic membrane, ear canal and external ear normal. Tympanic membrane is not erythematous or bulging.     Mouth/Throat:     Pharynx: Uvula midline. No oropharyngeal exudate or posterior oropharyngeal erythema.  Cardiovascular:     Rate and Rhythm: Normal rate and regular rhythm.     Heart  sounds: Normal heart sounds, S1  normal and S2 normal. No murmur heard. Pulmonary:     Effort: Pulmonary effort is normal.     Breath sounds: Normal breath sounds. No wheezing, rhonchi or rales.     Comments: Clear to auscultation bilaterally Psychiatric:        Behavior: Behavior is cooperative.     UC Treatments / Results  Labs (all labs ordered are listed, but only abnormal results are displayed) Labs Reviewed - No data to display  EKG   Radiology No results found.  Procedures Procedures (including critical care time)  Medications Ordered in UC Medications - No data to display  Initial Impression / Assessment and Plan / UC Course  I have reviewed the triage vital signs and the nursing notes.  Pertinent labs & imaging results that were available during my care of the patient were reviewed by me and considered in my medical decision making (see chart for details).      No indication for viral testing as patient has been symptomatic for several weeks.  Patient was started on antibiotics given prolonged and worsening symptoms.  She was instructed to continue using Tessalon as previously prescribed for cough symptoms.  Recommended over-the-counter medications including Flonase, Mucinex, Tylenol for symptom relief.  Discussed that if her cough/sore throat symptoms persist after antibiotics and several other courses of medication she should follow-up with an ENT.  She would need to contact her primary care provider for ENT referral should symptoms persist.  Discussed alarm symptoms that warrant emergent evaluation.  Strict return precautions given to which she expressed understanding.  Final Clinical Impressions(s) / UC Diagnoses   Final diagnoses:  Acute recurrent pansinusitis  Cough     Discharge Instructions      We are treating you with an antibiotic.  Please continue using Tessalon as needed for cough.  If your symptoms or not improving follow-up with ENT as we discussed.     ED Prescriptions      Medication Sig Dispense Auth. Provider   amoxicillin-clavulanate (AUGMENTIN) 875-125 MG tablet Take 1 tablet by mouth every 12 (twelve) hours for 10 days. 20 tablet Oktober Glazer, Noberto Retort, PA-C      PDMP not reviewed this encounter.   Jeani Hawking, PA-C 03/01/21 1648    Besan Ketchem, Noberto Retort, PA-C 03/01/21 1648

## 2021-03-01 NOTE — Discharge Instructions (Signed)
We are treating you with an antibiotic.  Please continue using Tessalon as needed for cough.  If your symptoms or not improving follow-up with ENT as we discussed.

## 2021-03-01 NOTE — ED Triage Notes (Signed)
Daughter recent DX with RSV.

## 2021-03-01 NOTE — ED Triage Notes (Signed)
Pt reports productive cough with green to yellow mucous.

## 2021-03-10 ENCOUNTER — Ambulatory Visit (HOSPITAL_COMMUNITY)
Admission: EM | Admit: 2021-03-10 | Discharge: 2021-03-10 | Disposition: A | Discharge status: Home / Self Care | Payer: BC Managed Care – PPO

## 2021-03-10 ENCOUNTER — Encounter (HOSPITAL_COMMUNITY): Payer: Self-pay | Admitting: Emergency Medicine

## 2021-03-10 ENCOUNTER — Other Ambulatory Visit: Payer: Self-pay

## 2021-03-10 DIAGNOSIS — Z7689 Persons encountering health services in other specified circumstances: Secondary | ICD-10-CM

## 2021-03-10 NOTE — Discharge Instructions (Addendum)
-  Continue flonase if needed for lingering congestion

## 2021-03-10 NOTE — ED Provider Notes (Signed)
[facility]    CSN: [id number] Arrival date & time: 03/10/21  5284      History   Chief Complaint Chief Complaint  Patient presents with   Letter for School/Work    HPI ELANORA QUIN is a 35 y.o. female presenting for clearance to return to work. Medical history allergic rhinitis, anxiety. States she was here 9/24, diagnosed with sinusitis, prescribed augmentin which she has taken as directed with resolution of symptoms. Requiring note to return to work. Negative covid test already.   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 Authorizing Provider  Albuterol Sulfate (PROAIR RESPICLICK) 108 (90 Base) MCG/ACT AEPB Inhale 2 puffs into the lungs every 6 (six) hours as needed. 09/30/15   Fletcher Anon, MD  amoxicillin-clavulanate (AUGMENTIN) 875-125 MG tablet Take 1 tablet by mouth every 12 (twelve) hours for 10 days. 03/01/21 03/11/21  Raspet, Noberto Retort, PA-C  Brimonidine Tartrate (LUMIFY) 0.025 % SOLN Apply to eye.    [provider]  fexofenadine (ALLEGRA ALLERGY) 180 MG tablet Take 180 mg by mouth daily.    [provider]  ibuprofen (ADVIL) 600 MG tablet Take 1 tablet (600 mg total) by mouth every 6 (six) hours as  needed. 03/05/19   Domenick Gong, MD  ketotifen (ALAWAY) 0.025 % ophthalmic solution Place 1 drop into both eyes 2 (two) times daily.    [provider]  montelukast (SINGULAIR) 10 MG tablet One tablet daily for cough or wheeze. 09/30/15   Fletcher Anon, MD  NON FORMULARY Using a cream for shingles    [provider]  Norgestimate-Ethinyl Estradiol Triphasic 0.18/0.215/0.25 MG-35 MCG tablet Take 1 tablet by mouth daily.    [provider]    Family History Family History  Problem Relation Age of Onset   Hyperlipidemia Mother    Diabetes Mother    Allergic rhinitis Mother    Hyperlipidemia Maternal Grandmother    Cancer Maternal Grandfather        lung, prostate   Diabetes Maternal Grandfather    Diabetes Paternal Grandmother    Diabetes Paternal Grandfather    Cancer Paternal Aunt        colon   Anesthesia problems Neg Hx    Malignant hyperthermia Neg Hx    Hypotension Neg Hx    Pseudochol deficiency Neg Hx    Hearing loss  Neg Hx    Angioedema Neg Hx    Asthma Neg Hx    Eczema Neg Hx    Immunodeficiency Neg Hx    Urticaria Neg Hx     Social History Social History   Tobacco Use   Smoking status: Never   Smokeless tobacco: Never  Substance Use Topics   Alcohol use: Yes   Drug use: No     Allergies   Oxycodone and Latex   Review of Systems Review of Systems  Constitutional:  Negative for appetite change, chills and fever.  HENT:  Negative for congestion, ear pain, rhinorrhea, sinus pressure, sinus pain and sore throat.   Eyes:  Negative for redness and visual disturbance.  Respiratory:  Negative for cough, chest tightness, shortness of breath and wheezing.   Cardiovascular:  Negative for chest pain and palpitations.  Gastrointestinal:  Negative for abdominal pain, constipation, diarrhea, nausea and vomiting.  Genitourinary:  Negative for dysuria, frequency and urgency.  Musculoskeletal:  Negative for myalgias.  Neurological:   Negative for dizziness, weakness and headaches.  Psychiatric/Behavioral:  Negative for confusion.   All other systems reviewed and are negative.   Physical Exam Triage Vital Signs ED Triage Vitals  Enc Vitals Group     BP 03/10/21 1100 135/88     Pulse Rate 03/10/21 1100 82     Resp 03/10/21 1100 17     Temp 03/10/21 1100 98.2 F (36.8 C)     Temp Source 03/10/21 1100 Oral     SpO2 03/10/21 1100 98 %     Weight --      Height --      Head Circumference --      Peak Flow --      Pain Score 03/10/21 1128 0     Pain Loc --      Pain Edu? --      Excl. in GC? --    No data found.  Updated Vital Signs BP 135/88 (BP Location: Left Arm)   Pulse 82   Temp 98.2 F (36.8 C) (Oral)   Resp 17   SpO2 98%   Visual Acuity Right Eye Distance:   Left Eye Distance:   Bilateral Distance:    Right Eye Near:   Left Eye Near:    Bilateral Near:     Physical Exam Vitals reviewed.  Constitutional:      General: She is not in acute distress.    Appearance: Normal appearance. She is not ill-appearing.  HENT:     Head: Normocephalic and atraumatic.     Right Ear: Tympanic membrane, ear canal and external ear normal. No tenderness. No middle ear effusion. There is no impacted cerumen. Tympanic membrane is not perforated, erythematous, retracted or bulging.     Left Ear: Tympanic membrane, ear canal and external ear normal. No tenderness.  No middle ear effusion. There is no impacted cerumen. Tympanic membrane is not perforated, erythematous, retracted or bulging.     Nose: Nose normal. No congestion.     Mouth/Throat:     Mouth: Mucous membranes are moist.     Pharynx: Uvula midline. No oropharyngeal exudate or posterior oropharyngeal erythema.  Eyes:     Extraocular Movements: Extraocular movements intact.     Pupils: Pupils are equal, round, and reactive to light.  Cardiovascular:     Rate and Rhythm: Normal rate and regular rhythm.     Heart sounds: Normal heart sounds.   Pulmonary:     Effort: Pulmonary  effort is normal.     Breath sounds: Normal breath sounds. No decreased breath sounds, wheezing, rhonchi or rales.  Abdominal:     Palpations: Abdomen is soft.     Tenderness: There is no abdominal tenderness. There is no guarding or rebound.  Neurological:     General: No focal deficit present.     Mental Status: She is alert and oriented to person, place, and time.  Psychiatric:        Mood and Affect: Mood normal.        Behavior: Behavior normal.        Thought Content: Thought content normal.        Judgment: Judgment normal.     UC Treatments / Results  Labs (all labs ordered are listed, but only abnormal results are displayed) Labs Reviewed - No data to display  EKG   Radiology No results found.  Procedures Procedures (including critical care time)  Medications Ordered in UC Medications - No data to display  Initial Impression / Assessment and Plan / UC Course  I have reviewed the triage vital signs and the nursing notes.  Pertinent labs & imaging results that were available during my care of the patient were reviewed by me and considered in my medical decision making (see chart for details).     This patient is a very pleasant 35 y.o. year old female presenting for clearance to return to work. Treated for sinusitis 9/24, negative covid test. Continue allergy medication and flonase prn.  Work note provided. ED return precautions discussed. Patient verbalizes understanding and agreement.     Final Clinical Impressions(s) / UC Diagnoses   Final diagnoses:  Return to work evaluation     Discharge Instructions      -Continue flonase if needed for lingering congestion     ED Prescriptions   None    PDMP not reviewed this encounter.   Rhys Martini, PA-C 03/10/21 1142

## 2021-03-10 NOTE — ED Triage Notes (Signed)
Pt reports that she was seen here on 9/24 and was put on antibiotics. Supposed to start a new job and was told that she cant come to work without a note.

## 2021-11-05 ENCOUNTER — Telehealth (INDEPENDENT_AMBULATORY_CARE_PROVIDER_SITE_OTHER): Payer: Self-pay | Admitting: Pediatric Endocrinology

## 2021-11-05 NOTE — Telephone Encounter (Signed)
  Name of who is calling: Nolon Nations   Caller's Relationship to Patient: self  Best contact number: [phone]  Provider they see: Dr. Vanessa [place]  Reason for call: Patient has paperwork that needs to be filled out by Dr. Vanessa [place].  There is a diabetes care plan from [employer] and [facility] paperwork. She would like to be contacted when the paperwork is filled out and faxed.      PRESCRIPTION REFILL ONLY  Name of prescription:  Pharmacy:

## 2021-11-06 NOTE — Telephone Encounter (Signed)
I think the phone note just got linked to the wrong person's chart.

## 2021-11-06 NOTE — Telephone Encounter (Signed)
Phone note Encounter created under the child of this patient because paperwork she dropped off was intended to be about her Daughter Rebecca Roach

## 2022-12-11 ENCOUNTER — Encounter (INDEPENDENT_AMBULATORY_CARE_PROVIDER_SITE_OTHER): Payer: Self-pay

## 2023-12-15 DIAGNOSIS — Z13220 Encounter for screening for lipoid disorders: Secondary | ICD-10-CM | POA: Diagnosis not present

## 2023-12-15 DIAGNOSIS — Z0000 Encounter for general adult medical examination without abnormal findings: Secondary | ICD-10-CM | POA: Diagnosis not present

## 2024-03-30 DIAGNOSIS — Z1331 Encounter for screening for depression: Secondary | ICD-10-CM | POA: Diagnosis not present

## 2024-03-30 DIAGNOSIS — Z01419 Encounter for gynecological examination (general) (routine) without abnormal findings: Secondary | ICD-10-CM | POA: Diagnosis not present
